# Patient Record
Sex: Female | Born: 1945 | Race: White | Hispanic: No | Marital: Married | Smoking: Former smoker
Health system: Southern US, Community
[De-identification: ages and names within clinical notes are randomized; demographics above are authoritative.]

## PROBLEM LIST (undated history)

## (undated) DIAGNOSIS — E079 Disorder of thyroid, unspecified: Secondary | ICD-10-CM

## (undated) DIAGNOSIS — I10 Essential (primary) hypertension: Secondary | ICD-10-CM

## (undated) DIAGNOSIS — E119 Type 2 diabetes mellitus without complications: Secondary | ICD-10-CM

## (undated) DIAGNOSIS — M1990 Unspecified osteoarthritis, unspecified site: Secondary | ICD-10-CM

## (undated) HISTORY — PX: JOINT REPLACEMENT: SHX530

---

## 2000-02-06 ENCOUNTER — Inpatient Hospital Stay (HOSPITAL_COMMUNITY): Admission: EM | Admit: 2000-02-06 | Discharge: 2000-02-07 | Payer: Self-pay | Admitting: Emergency Medicine

## 2000-02-06 ENCOUNTER — Encounter: Payer: Self-pay | Admitting: Emergency Medicine

## 2000-02-07 ENCOUNTER — Encounter: Payer: Self-pay | Admitting: Internal Medicine

## 2003-09-27 ENCOUNTER — Inpatient Hospital Stay (HOSPITAL_COMMUNITY): Admission: RE | Admit: 2003-09-27 | Discharge: 2003-09-30 | Payer: Self-pay | Admitting: Orthopedic Surgery

## 2006-09-03 ENCOUNTER — Encounter: Admission: RE | Admit: 2006-09-03 | Discharge: 2006-09-03 | Payer: Self-pay | Admitting: Orthopedic Surgery

## 2006-09-17 ENCOUNTER — Ambulatory Visit: Payer: Self-pay | Admitting: Internal Medicine

## 2006-09-30 ENCOUNTER — Inpatient Hospital Stay (HOSPITAL_COMMUNITY): Admission: RE | Admit: 2006-09-30 | Discharge: 2006-10-03 | Payer: Self-pay | Admitting: Orthopedic Surgery

## 2008-06-15 IMAGING — CR DG CHEST 2V
2 series · 2 of 2 positions shown · non-contrast
Comparison: none

CLINICAL DATA: Internal derangement.  Preoperative.
 CHEST ? 2 VIEW:

[view not recorded (1 of 2)]
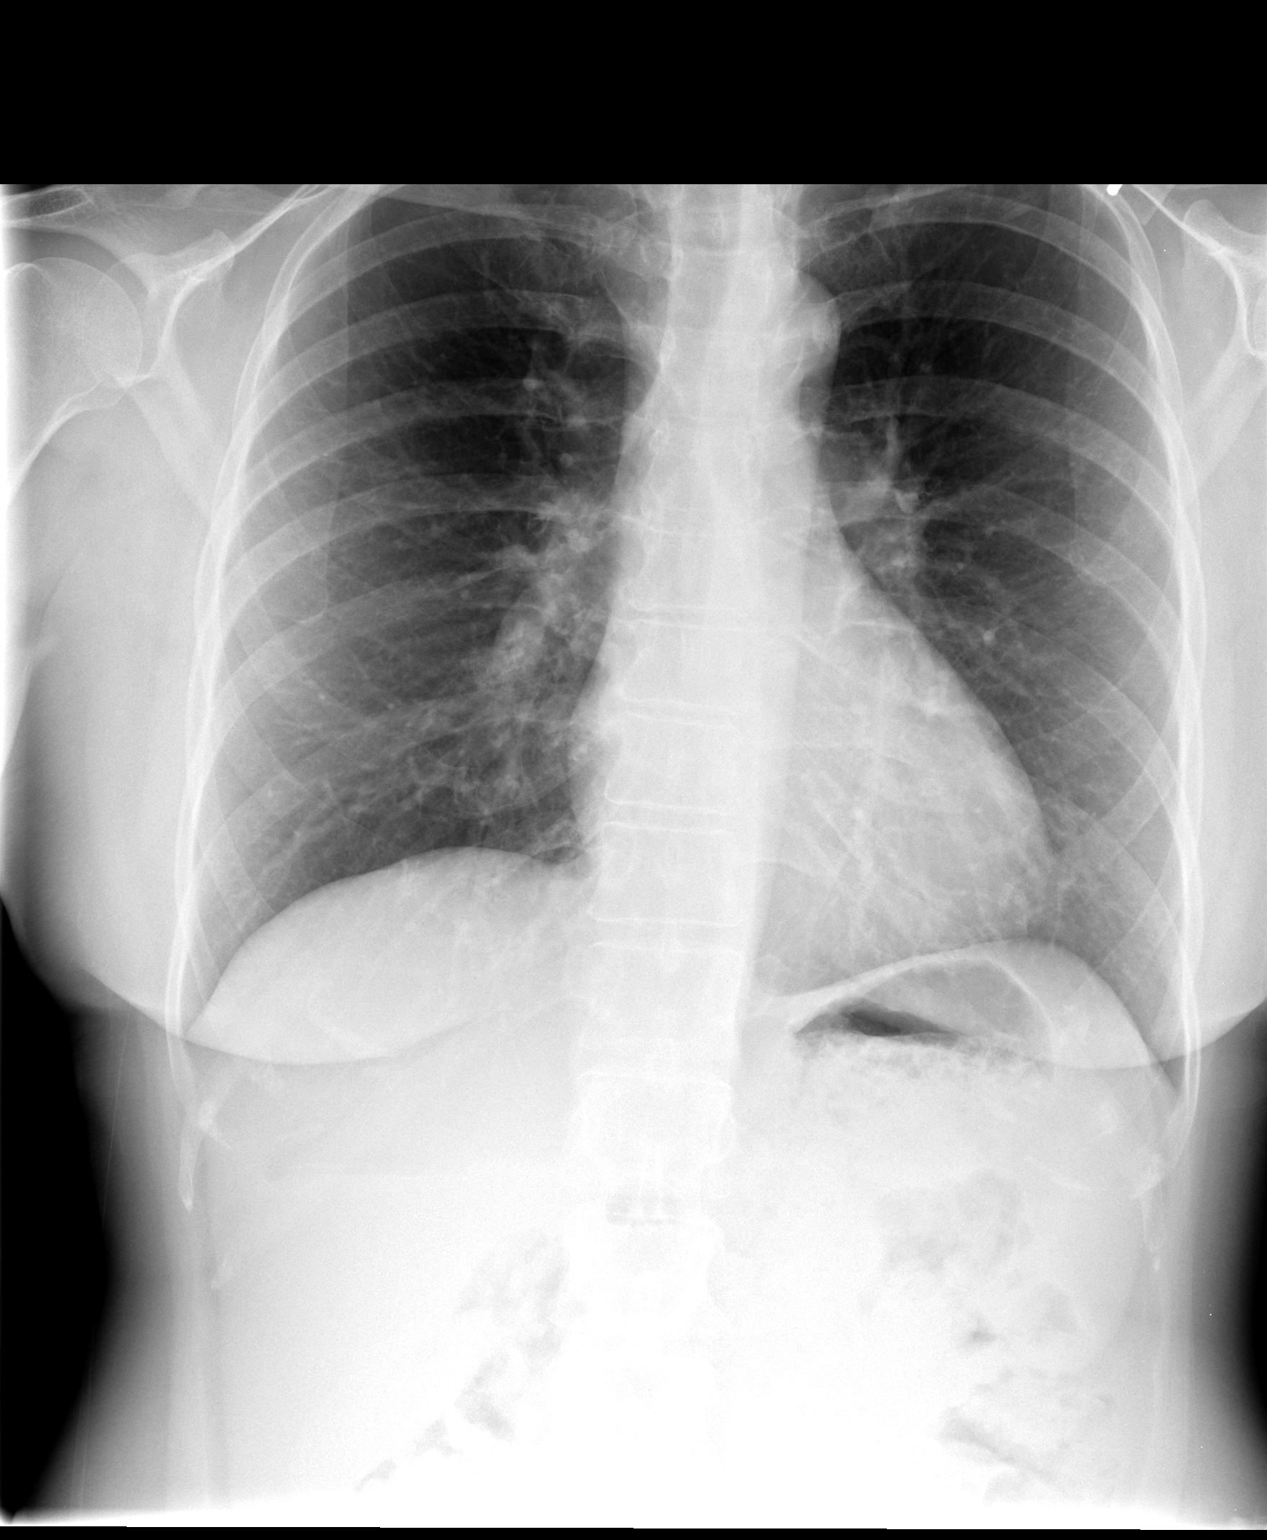

[view not recorded (2 of 2)]
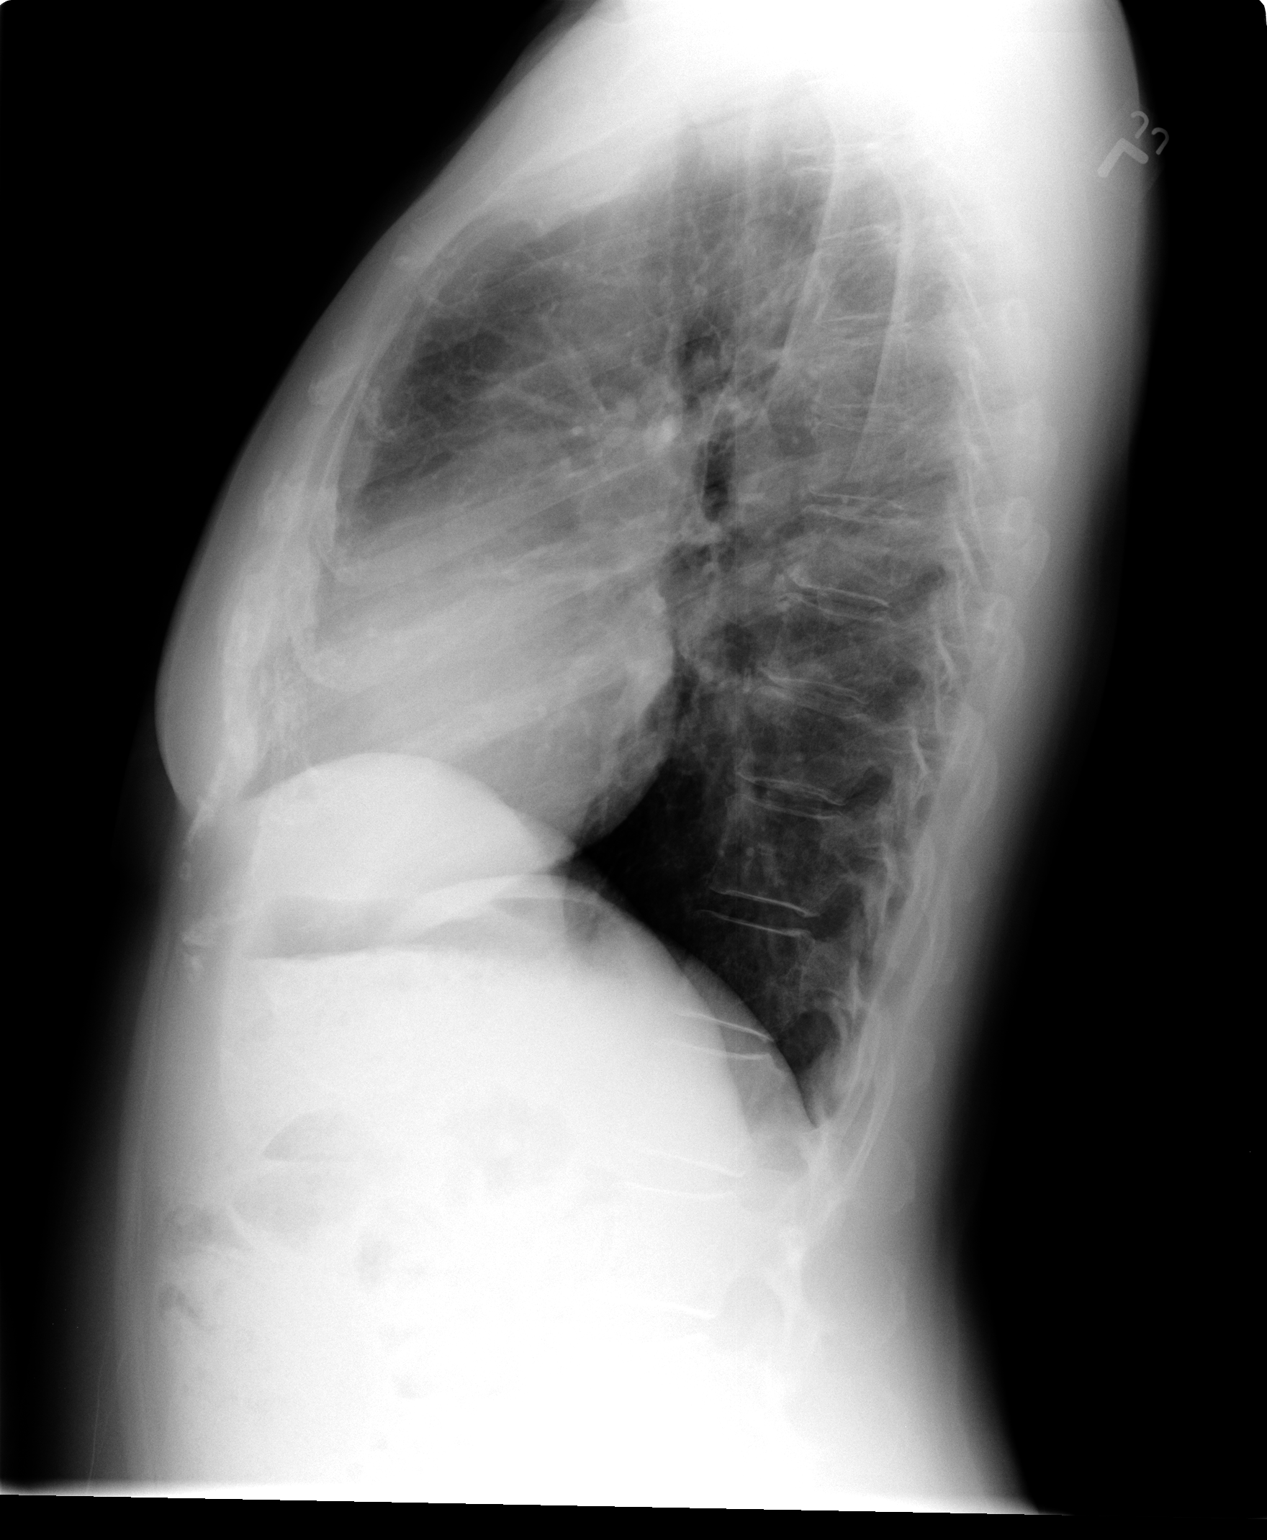

[2 of 2 positions shown; findings below may reference images not displayed]

FINDINGS: The lungs are clear and heart and mediastinal structures are normal.
IMPRESSION: No evidence for active chest disease.

## 2015-09-12 DIAGNOSIS — R609 Edema, unspecified: Secondary | ICD-10-CM | POA: Diagnosis not present

## 2015-09-12 DIAGNOSIS — M2550 Pain in unspecified joint: Secondary | ICD-10-CM | POA: Diagnosis not present

## 2015-09-12 DIAGNOSIS — M0609 Rheumatoid arthritis without rheumatoid factor, multiple sites: Secondary | ICD-10-CM | POA: Diagnosis not present

## 2015-09-12 DIAGNOSIS — M353 Polymyalgia rheumatica: Secondary | ICD-10-CM | POA: Diagnosis not present

## 2015-09-12 DIAGNOSIS — Z79899 Other long term (current) drug therapy: Secondary | ICD-10-CM | POA: Diagnosis not present

## 2015-09-12 DIAGNOSIS — Z23 Encounter for immunization: Secondary | ICD-10-CM | POA: Diagnosis not present

## 2015-09-12 DIAGNOSIS — L409 Psoriasis, unspecified: Secondary | ICD-10-CM | POA: Diagnosis not present

## 2015-10-05 ENCOUNTER — Emergency Department (HOSPITAL_COMMUNITY): Payer: Medicare HMO

## 2015-10-05 ENCOUNTER — Encounter (HOSPITAL_COMMUNITY): Payer: Self-pay | Admitting: Emergency Medicine

## 2015-10-05 ENCOUNTER — Emergency Department (HOSPITAL_COMMUNITY)
Admission: EM | Admit: 2015-10-05 | Discharge: 2015-10-05 | Disposition: A | Discharge status: Home / Self Care | Payer: Medicare HMO | Attending: Emergency Medicine | Admitting: Emergency Medicine

## 2015-10-05 DIAGNOSIS — M1990 Unspecified osteoarthritis, unspecified site: Secondary | ICD-10-CM | POA: Diagnosis not present

## 2015-10-05 DIAGNOSIS — E079 Disorder of thyroid, unspecified: Secondary | ICD-10-CM | POA: Insufficient documentation

## 2015-10-05 DIAGNOSIS — T148 Other injury of unspecified body region: Secondary | ICD-10-CM | POA: Diagnosis not present

## 2015-10-05 DIAGNOSIS — Y9389 Activity, other specified: Secondary | ICD-10-CM | POA: Insufficient documentation

## 2015-10-05 DIAGNOSIS — W108XXA Fall (on) (from) other stairs and steps, initial encounter: Secondary | ICD-10-CM | POA: Diagnosis not present

## 2015-10-05 DIAGNOSIS — E119 Type 2 diabetes mellitus without complications: Secondary | ICD-10-CM | POA: Insufficient documentation

## 2015-10-05 DIAGNOSIS — Z7951 Long term (current) use of inhaled steroids: Secondary | ICD-10-CM | POA: Insufficient documentation

## 2015-10-05 DIAGNOSIS — S42212A Unspecified displaced fracture of surgical neck of left humerus, initial encounter for closed fracture: Secondary | ICD-10-CM | POA: Insufficient documentation

## 2015-10-05 DIAGNOSIS — Y9289 Other specified places as the place of occurrence of the external cause: Secondary | ICD-10-CM | POA: Diagnosis not present

## 2015-10-05 DIAGNOSIS — S42202A Unspecified fracture of upper end of left humerus, initial encounter for closed fracture: Secondary | ICD-10-CM | POA: Diagnosis not present

## 2015-10-05 DIAGNOSIS — I10 Essential (primary) hypertension: Secondary | ICD-10-CM | POA: Diagnosis not present

## 2015-10-05 DIAGNOSIS — Z794 Long term (current) use of insulin: Secondary | ICD-10-CM | POA: Insufficient documentation

## 2015-10-05 DIAGNOSIS — S4992XA Unspecified injury of left shoulder and upper arm, initial encounter: Secondary | ICD-10-CM | POA: Diagnosis present

## 2015-10-05 DIAGNOSIS — Z87891 Personal history of nicotine dependence: Secondary | ICD-10-CM | POA: Insufficient documentation

## 2015-10-05 DIAGNOSIS — Z79899 Other long term (current) drug therapy: Secondary | ICD-10-CM | POA: Insufficient documentation

## 2015-10-05 DIAGNOSIS — Y998 Other external cause status: Secondary | ICD-10-CM | POA: Insufficient documentation

## 2015-10-05 DIAGNOSIS — M25512 Pain in left shoulder: Secondary | ICD-10-CM | POA: Diagnosis not present

## 2015-10-05 DIAGNOSIS — S42302A Unspecified fracture of shaft of humerus, left arm, initial encounter for closed fracture: Secondary | ICD-10-CM

## 2015-10-05 HISTORY — DX: Essential (primary) hypertension: I10

## 2015-10-05 HISTORY — DX: Unspecified osteoarthritis, unspecified site: M19.90

## 2015-10-05 HISTORY — DX: Disorder of thyroid, unspecified: E07.9

## 2015-10-05 HISTORY — DX: Type 2 diabetes mellitus without complications: E11.9

## 2015-10-05 MED ORDER — ONDANSETRON HCL 4 MG/2ML IJ SOLN
4.0000 mg | Freq: Once | INTRAMUSCULAR | Status: AC
  Administered 2015-10-05: 4 mg via INTRAVENOUS
  Filled 2015-10-05: qty 2

## 2015-10-05 MED ORDER — OXYCODONE-ACETAMINOPHEN 5-325 MG PO TABS: 2.0000 | ORAL_TABLET | ORAL | Status: AC | PRN

## 2015-10-05 MED ORDER — MORPHINE SULFATE (PF) 4 MG/ML IV SOLN
4.0000 mg | Freq: Once | INTRAVENOUS | Status: AC
  Administered 2015-10-05: 4 mg via INTRAVENOUS
  Filled 2015-10-05: qty 1

## 2015-10-05 MED ORDER — ONDANSETRON HCL 4 MG PO TABS: 4.0000 mg | ORAL_TABLET | Freq: Four times a day (QID) | ORAL | Status: AC

## 2015-10-05 NOTE — ED Notes (Signed)
As wheeling pt out to waiting room pt became very nauseated with vomiting. PA [facility] notified and given prescription for zofran to be d/c with. Otherwise, NAD. A/O x4. VSS

## 2015-10-05 NOTE — ED Provider Notes (Signed)
Medical screening examination/treatment/procedure(s) were conducted as a shared visit with non-physician practitioner(s) and myself.  I personally evaluated the patient during the encounter.  Pt fell this morning landing on her left shoulder.  No other injury complaints. No spinal ttp  On exam.  DC home with sling, ortho follow up   Linwood Dibbles, MD 10/06/15 [phone]

## 2015-10-05 NOTE — ED Provider Notes (Signed)
CSN: [id number]     Arrival date & time 10/05/15  1157 History   First MD Initiated Contact with Patient 10/05/15 1158     Chief Complaint  Patient presents with  . Shoulder Pain     (Consider location/radiation/quality/duration/timing/severity/associated sxs/prior Treatment) The history is provided by the patient. No language interpreter was used.  Teresa Harmon is a 69 y.o female with a history of hypertension, diabetes, thyroid disease, and arthritis who presents via EMS after sudden trip and fall while going up the stairs, landing on her left shoulder. She denies any treatment prior to arrival but was given 50 of fentanyl en route. No prior surgeries or injury to the left shoulder. She denies any numbness or tingling. She denies any head injury or loss of consciousness.  Past Medical History  Diagnosis Date  . Diabetes mellitus without complication (HCC)   . Thyroid disease   . Hypertension   . Arthritis    Past Surgical History  Procedure Laterality Date  . Joint replacement     History reviewed. No pertinent family history. Social History  Substance Use Topics  . Smoking status: Former Smoker -- 1.00 packs/day for 15 years    Types: Cigarettes  . Smokeless tobacco: None  . Alcohol Use: No   OB History    No data available     Review of Systems  Musculoskeletal: Positive for joint swelling and arthralgias.  Skin: Negative for wound.      Allergies  Review of patient's allergies indicates no known allergies.  Home Medications   Prior to Admission medications   Medication Sig Start Date End Date Taking? Authorizing Provider  alendronate (FOSAMAX) 70 MG tablet Take 70 mg by mouth once a week. Take with a full glass of water on an empty stomach.   Yes Historical Provider, MD  aspirin EC 81 MG tablet Take 81 mg by mouth daily.   Yes Historical Provider, MD  calcium-vitamin D 250-100 MG-UNIT tablet Take 1 tablet by mouth 2 (two) times daily.   Yes Historical  Provider, MD  folic acid (FOLVITE) 1 MG tablet Take 1 mg by mouth daily.   Yes Historical Provider, MD  insulin NPH-regular Human (NOVOLIN 70/30) (70-30) 100 UNIT/ML injection Inject 40 Units into the skin 3 (three) times daily.   Yes Historical Provider, MD  levothyroxine (SYNTHROID, LEVOTHROID) 100 MCG tablet Take 100 mcg by mouth daily before breakfast.   Yes Historical Provider, MD  methotrexate (RHEUMATREX) 2.5 MG tablet Take 15 mg by mouth once a week. Caution:Chemotherapy. Protect from light.   Yes Historical Provider, MD  metoprolol (LOPRESSOR) 50 MG tablet Take 50 mg by mouth 2 (two) times daily.   Yes Historical Provider, MD  predniSONE (DELTASONE) 5 MG tablet Take 5 mg by mouth every other day.   Yes Historical Provider, MD  simvastatin (ZOCOR) 40 MG tablet Take 40 mg by mouth at bedtime.   Yes Historical Provider, MD  valsartan-hydrochlorothiazide (DIOVAN-HCT) 160-12.5 MG tablet Take 1 tablet by mouth daily.   Yes Historical Provider, MD  valsartan-hydrochlorothiazide (DIOVAN-HCT) 160-25 MG tablet Take 1 tablet by mouth daily.   Yes Historical Provider, MD  ondansetron (ZOFRAN) 4 MG tablet Take 1 tablet (4 mg total) by mouth every 6 (six) hours. 10/05/15   Ryliegh Mcduffey Patel-Mills, PA-C  oxyCODONE-acetaminophen (PERCOCET/ROXICET) 5-325 MG tablet Take 2 tablets by mouth every 4 (four) hours as needed for severe pain. 10/05/15   Gabrille Kilbride Patel-Mills, PA-C   BP 140/76 mmHg  Pulse 59  Temp(Src)  98.6 F (37 C) (Oral)  Resp 20  SpO2 95% Physical Exam  Constitutional: She is oriented to person, place, and time. She appears well-developed and well-nourished.  HENT:  Head: Normocephalic and atraumatic.  Eyes: Conjunctivae are normal.  Neck: Normal range of motion. Neck supple.  Cardiovascular: Normal rate.   Pulmonary/Chest: Effort normal. No respiratory distress.  Abdominal: Soft.  Musculoskeletal: Normal range of motion.  Left shoulder: Left shoulder is in a makeshift sling. 2+ radial pulses.  Less than 2 seconds capillary refill. 5 out of 5 grip strength. Mild swelling and moderate tenderness along the proximal humerus.  No clavicle deformity.  No ecchymosis or erythema noted.  No respiratory distress or decreased breath sounds.   Neurological: She is alert and oriented to person, place, and time.  Skin: Skin is warm and dry.  Psychiatric: She has a normal mood and affect.    ED Course  Procedures (including critical care time) Labs Review Labs Reviewed - No data to display  Imaging Review Dg Shoulder Left  10/05/2015  CLINICAL DATA:  LEFT shoulder pain and swelling after fall EXAM: LEFT SHOULDER - 2+ VIEW COMPARISON:  None. FINDINGS: Impaction fracture at the surgical neck of the proximal LEFT humerus. The glenohumeral joint is intact. IMPRESSION: Impaction fracture of the proximal LEFT humerus. Electronically Signed   By: Genevive Bi M.D.   On: 10/05/2015 13:04   I have personally reviewed and evaluated these image results as part of my medical decision-making.   EKG Interpretation None      MDM   Final diagnoses:  Humerus fracture, left, closed, initial encounter   Patient presents for left shoulder pain after trip and fall while going up the stairs. She is well-appearing and in no acute distress. Her vital signs are stable. After fentanyl given en route by EMS, patient states her pain is only dull now. X-ray of the left shoulder shows an impaction fracture of the proximal left humerus. She has no sensory deficit and pulses are in tact. The patient was put in a left arm sling and given follow-up with Dr.Xu, orthopedics. She was given Percocet for pain control. I discussed return precautions. Patient verbally agrees with the plan. Upon discharge patient had one episode of vomiting after being given morphine with Zofran. Additional Zofran was ordered. I believe this is most likely from pain medications. Patient was given prescription for Zofran.   Catha Gosselin, PA-C 10/05/15 [phone]

## 2015-10-05 NOTE — Discharge Instructions (Signed)
Humerus Fracture Treated With Immobilization Follow-up with Dr. Roda Shutters, the orthopedist, by calling his office.  Take Percocet as needed for pain. Wear shoulder immobilizer except when showering. The humerus is the large bone in your upper arm. You have a broken (fractured) humerus. These fractures are easily diagnosed with X-rays. TREATMENT  Simple fractures which will heal without disability are treated with simple immobilization. Immobilization means you will wear a cast, splint, or sling. You have a fracture which will do well with immobilization. The fracture will heal well simply by being held in a good position until it is stable enough to begin range of motion exercises. Do not take part in activities which would further injure your arm.  HOME CARE INSTRUCTIONS   Put ice on the injured area.  Put ice in a plastic bag.  Place a towel between your skin and the bag.  Leave the ice on for 15-20 minutes, 03-04 times a day.  If you have a cast:  Do not scratch the skin under the cast using sharp or pointed objects.  Check the skin around the cast every day. You may put lotion on any red or sore areas.  Keep your cast dry and clean.  If you have a splint:  Wear the splint as directed.  Keep your splint dry and clean.  You may loosen the elastic around the splint if your fingers become numb, tingle, or turn cold or blue.  If you have a sling:  Wear the sling as directed.  Do not put pressure on any part of your cast or splint until it is fully hardened.  Your cast or splint can be protected during bathing with a plastic bag. Do not lower the cast or splint into water.  Only take over-the-counter or prescription medicines for pain, discomfort, or fever as directed by your caregiver.  Do range of motion exercises as instructed by your caregiver.  Follow up as directed by your caregiver. This is very important in order to avoid permanent injury or disability and chronic  pain. SEEK IMMEDIATE MEDICAL CARE IF:   Your skin or nails in the injured arm turn blue or gray.  Your arm feels cold or numb.  You develop severe pain in the injured arm.  You are having problems with the medicines you were given. MAKE SURE YOU:   Understand these instructions.  Will watch your condition.  Will get help right away if you are not doing well or get worse.   This information is not intended to replace advice given to you by your health care provider. Make sure you discuss any questions you have with your health care provider.   Document Released: 02/11/2001 Document Revised: 11/26/2014 Document Reviewed: 03/30/2015 Elsevier Interactive Patient Education [employer]

## 2015-10-05 NOTE — ED Notes (Signed)
Attempted to apply arm sling to pt, however pt refusing to wear it until MD Knapp steps in to assess patient.

## 2015-10-05 NOTE — ED Notes (Signed)
Pt to ER via [facility] after tripping up the stairs and falling landing on her left shoulder. Pt complaining of severe pain, was given 50 mcg of fentanyl in route. Swelling to left shoulder noted. Pulses and sensation present in left arm. VSS on arrival.

## 2015-10-06 DIAGNOSIS — S42202A Unspecified fracture of upper end of left humerus, initial encounter for closed fracture: Secondary | ICD-10-CM | POA: Diagnosis not present

## 2015-10-17 DIAGNOSIS — S42202D Unspecified fracture of upper end of left humerus, subsequent encounter for fracture with routine healing: Secondary | ICD-10-CM | POA: Diagnosis not present

## 2015-10-17 DIAGNOSIS — S52124A Nondisplaced fracture of head of right radius, initial encounter for closed fracture: Secondary | ICD-10-CM | POA: Diagnosis not present

## 2015-10-19 DIAGNOSIS — E039 Hypothyroidism, unspecified: Secondary | ICD-10-CM | POA: Diagnosis not present

## 2015-10-19 DIAGNOSIS — E782 Mixed hyperlipidemia: Secondary | ICD-10-CM | POA: Diagnosis not present

## 2015-10-19 DIAGNOSIS — Z79899 Other long term (current) drug therapy: Secondary | ICD-10-CM | POA: Diagnosis not present

## 2015-10-19 DIAGNOSIS — D509 Iron deficiency anemia, unspecified: Secondary | ICD-10-CM | POA: Diagnosis not present

## 2015-10-19 DIAGNOSIS — I10 Essential (primary) hypertension: Secondary | ICD-10-CM | POA: Diagnosis not present

## 2015-10-19 DIAGNOSIS — E559 Vitamin D deficiency, unspecified: Secondary | ICD-10-CM | POA: Diagnosis not present

## 2015-10-19 DIAGNOSIS — E1169 Type 2 diabetes mellitus with other specified complication: Secondary | ICD-10-CM | POA: Diagnosis not present

## 2015-10-19 DIAGNOSIS — Z13820 Encounter for screening for osteoporosis: Secondary | ICD-10-CM | POA: Diagnosis not present

## 2015-10-19 DIAGNOSIS — Z1231 Encounter for screening mammogram for malignant neoplasm of breast: Secondary | ICD-10-CM | POA: Diagnosis not present

## 2015-10-19 DIAGNOSIS — Z0000 Encounter for general adult medical examination without abnormal findings: Secondary | ICD-10-CM | POA: Diagnosis not present

## 2015-10-19 DIAGNOSIS — Z1211 Encounter for screening for malignant neoplasm of colon: Secondary | ICD-10-CM | POA: Diagnosis not present

## 2015-10-21 DIAGNOSIS — R8290 Unspecified abnormal findings in urine: Secondary | ICD-10-CM | POA: Diagnosis not present

## 2015-11-08 DIAGNOSIS — E039 Hypothyroidism, unspecified: Secondary | ICD-10-CM | POA: Diagnosis not present

## 2015-11-08 DIAGNOSIS — I10 Essential (primary) hypertension: Secondary | ICD-10-CM | POA: Diagnosis not present

## 2015-11-08 DIAGNOSIS — Z0000 Encounter for general adult medical examination without abnormal findings: Secondary | ICD-10-CM | POA: Diagnosis not present

## 2015-11-08 DIAGNOSIS — Z136 Encounter for screening for cardiovascular disorders: Secondary | ICD-10-CM | POA: Diagnosis not present

## 2015-11-08 DIAGNOSIS — Z1211 Encounter for screening for malignant neoplasm of colon: Secondary | ICD-10-CM | POA: Diagnosis not present

## 2015-11-08 DIAGNOSIS — Z1231 Encounter for screening mammogram for malignant neoplasm of breast: Secondary | ICD-10-CM | POA: Diagnosis not present

## 2015-11-08 DIAGNOSIS — Z6831 Body mass index (BMI) 31.0-31.9, adult: Secondary | ICD-10-CM | POA: Diagnosis not present

## 2015-11-08 DIAGNOSIS — Z13820 Encounter for screening for osteoporosis: Secondary | ICD-10-CM | POA: Diagnosis not present

## 2015-11-08 DIAGNOSIS — E1169 Type 2 diabetes mellitus with other specified complication: Secondary | ICD-10-CM | POA: Diagnosis not present

## 2015-11-08 DIAGNOSIS — E782 Mixed hyperlipidemia: Secondary | ICD-10-CM | POA: Diagnosis not present

## 2015-12-09 DIAGNOSIS — E782 Mixed hyperlipidemia: Secondary | ICD-10-CM | POA: Diagnosis not present

## 2015-12-09 DIAGNOSIS — E1169 Type 2 diabetes mellitus with other specified complication: Secondary | ICD-10-CM | POA: Diagnosis not present

## 2015-12-09 DIAGNOSIS — E039 Hypothyroidism, unspecified: Secondary | ICD-10-CM | POA: Diagnosis not present

## 2015-12-12 DIAGNOSIS — M0609 Rheumatoid arthritis without rheumatoid factor, multiple sites: Secondary | ICD-10-CM | POA: Diagnosis not present

## 2015-12-12 DIAGNOSIS — Z79899 Other long term (current) drug therapy: Secondary | ICD-10-CM | POA: Diagnosis not present

## 2015-12-12 DIAGNOSIS — M810 Age-related osteoporosis without current pathological fracture: Secondary | ICD-10-CM | POA: Diagnosis not present

## 2015-12-13 DIAGNOSIS — Z1231 Encounter for screening mammogram for malignant neoplasm of breast: Secondary | ICD-10-CM | POA: Diagnosis not present

## 2015-12-17 DIAGNOSIS — R69 Illness, unspecified: Secondary | ICD-10-CM | POA: Diagnosis not present

## 2016-01-10 DIAGNOSIS — S42202D Unspecified fracture of upper end of left humerus, subsequent encounter for fracture with routine healing: Secondary | ICD-10-CM | POA: Diagnosis not present

## 2016-02-13 DIAGNOSIS — E1169 Type 2 diabetes mellitus with other specified complication: Secondary | ICD-10-CM | POA: Diagnosis not present

## 2016-02-13 DIAGNOSIS — E039 Hypothyroidism, unspecified: Secondary | ICD-10-CM | POA: Diagnosis not present

## 2016-02-13 DIAGNOSIS — E785 Hyperlipidemia, unspecified: Secondary | ICD-10-CM | POA: Diagnosis not present

## 2016-02-13 DIAGNOSIS — E782 Mixed hyperlipidemia: Secondary | ICD-10-CM | POA: Diagnosis not present

## 2016-02-13 DIAGNOSIS — I10 Essential (primary) hypertension: Secondary | ICD-10-CM | POA: Diagnosis not present

## 2016-02-13 DIAGNOSIS — E559 Vitamin D deficiency, unspecified: Secondary | ICD-10-CM | POA: Diagnosis not present

## 2016-03-12 DIAGNOSIS — E782 Mixed hyperlipidemia: Secondary | ICD-10-CM | POA: Diagnosis not present

## 2016-03-12 DIAGNOSIS — Z79899 Other long term (current) drug therapy: Secondary | ICD-10-CM | POA: Diagnosis not present

## 2016-03-12 DIAGNOSIS — M2550 Pain in unspecified joint: Secondary | ICD-10-CM | POA: Diagnosis not present

## 2016-03-12 DIAGNOSIS — M0609 Rheumatoid arthritis without rheumatoid factor, multiple sites: Secondary | ICD-10-CM | POA: Diagnosis not present

## 2016-03-22 DIAGNOSIS — R69 Illness, unspecified: Secondary | ICD-10-CM | POA: Diagnosis not present

## 2016-05-16 DIAGNOSIS — E039 Hypothyroidism, unspecified: Secondary | ICD-10-CM | POA: Diagnosis not present

## 2016-05-16 DIAGNOSIS — E782 Mixed hyperlipidemia: Secondary | ICD-10-CM | POA: Diagnosis not present

## 2016-05-16 DIAGNOSIS — I10 Essential (primary) hypertension: Secondary | ICD-10-CM | POA: Diagnosis not present

## 2016-05-16 DIAGNOSIS — Z79899 Other long term (current) drug therapy: Secondary | ICD-10-CM | POA: Diagnosis not present

## 2016-05-16 DIAGNOSIS — E559 Vitamin D deficiency, unspecified: Secondary | ICD-10-CM | POA: Diagnosis not present

## 2016-05-16 DIAGNOSIS — E1169 Type 2 diabetes mellitus with other specified complication: Secondary | ICD-10-CM | POA: Diagnosis not present

## 2016-05-16 DIAGNOSIS — Z794 Long term (current) use of insulin: Secondary | ICD-10-CM | POA: Diagnosis not present

## 2016-06-11 DIAGNOSIS — Z79899 Other long term (current) drug therapy: Secondary | ICD-10-CM | POA: Diagnosis not present

## 2016-06-11 DIAGNOSIS — M06032 Rheumatoid arthritis without rheumatoid factor, left wrist: Secondary | ICD-10-CM | POA: Diagnosis not present

## 2016-06-18 DIAGNOSIS — R69 Illness, unspecified: Secondary | ICD-10-CM | POA: Diagnosis not present

## 2016-07-27 DIAGNOSIS — M0609 Rheumatoid arthritis without rheumatoid factor, multiple sites: Secondary | ICD-10-CM | POA: Diagnosis not present

## 2016-07-27 DIAGNOSIS — Z79899 Other long term (current) drug therapy: Secondary | ICD-10-CM | POA: Diagnosis not present

## 2016-07-27 DIAGNOSIS — M2550 Pain in unspecified joint: Secondary | ICD-10-CM | POA: Diagnosis not present

## 2016-08-16 DIAGNOSIS — Z23 Encounter for immunization: Secondary | ICD-10-CM | POA: Diagnosis not present

## 2016-08-16 DIAGNOSIS — E782 Mixed hyperlipidemia: Secondary | ICD-10-CM | POA: Diagnosis not present

## 2016-08-16 DIAGNOSIS — M79674 Pain in right toe(s): Secondary | ICD-10-CM | POA: Diagnosis not present

## 2016-08-16 DIAGNOSIS — E039 Hypothyroidism, unspecified: Secondary | ICD-10-CM | POA: Diagnosis not present

## 2016-08-16 DIAGNOSIS — M069 Rheumatoid arthritis, unspecified: Secondary | ICD-10-CM | POA: Diagnosis not present

## 2016-08-16 DIAGNOSIS — E1169 Type 2 diabetes mellitus with other specified complication: Secondary | ICD-10-CM | POA: Diagnosis not present

## 2016-08-16 DIAGNOSIS — E559 Vitamin D deficiency, unspecified: Secondary | ICD-10-CM | POA: Diagnosis not present

## 2016-08-16 DIAGNOSIS — I10 Essential (primary) hypertension: Secondary | ICD-10-CM | POA: Diagnosis not present

## 2016-10-16 DIAGNOSIS — R05 Cough: Secondary | ICD-10-CM | POA: Diagnosis not present

## 2016-10-22 DIAGNOSIS — E669 Obesity, unspecified: Secondary | ICD-10-CM | POA: Diagnosis not present

## 2016-10-22 DIAGNOSIS — E782 Mixed hyperlipidemia: Secondary | ICD-10-CM | POA: Diagnosis not present

## 2016-10-22 DIAGNOSIS — E1169 Type 2 diabetes mellitus with other specified complication: Secondary | ICD-10-CM | POA: Diagnosis not present

## 2016-10-22 DIAGNOSIS — Z1389 Encounter for screening for other disorder: Secondary | ICD-10-CM | POA: Diagnosis not present

## 2016-10-22 DIAGNOSIS — Z0000 Encounter for general adult medical examination without abnormal findings: Secondary | ICD-10-CM | POA: Diagnosis not present

## 2016-10-22 DIAGNOSIS — Z1231 Encounter for screening mammogram for malignant neoplasm of breast: Secondary | ICD-10-CM | POA: Diagnosis not present

## 2016-10-22 DIAGNOSIS — Z1211 Encounter for screening for malignant neoplasm of colon: Secondary | ICD-10-CM | POA: Diagnosis not present

## 2016-10-22 DIAGNOSIS — Z9181 History of falling: Secondary | ICD-10-CM | POA: Diagnosis not present

## 2016-10-30 DIAGNOSIS — R05 Cough: Secondary | ICD-10-CM | POA: Diagnosis not present

## 2016-10-30 DIAGNOSIS — M2550 Pain in unspecified joint: Secondary | ICD-10-CM | POA: Diagnosis not present

## 2016-10-30 DIAGNOSIS — Z79899 Other long term (current) drug therapy: Secondary | ICD-10-CM | POA: Diagnosis not present

## 2016-10-30 DIAGNOSIS — M0609 Rheumatoid arthritis without rheumatoid factor, multiple sites: Secondary | ICD-10-CM | POA: Diagnosis not present

## 2016-11-01 DIAGNOSIS — R69 Illness, unspecified: Secondary | ICD-10-CM | POA: Diagnosis not present

## 2016-12-17 DIAGNOSIS — Z1231 Encounter for screening mammogram for malignant neoplasm of breast: Secondary | ICD-10-CM | POA: Diagnosis not present

## 2016-12-17 DIAGNOSIS — I10 Essential (primary) hypertension: Secondary | ICD-10-CM | POA: Diagnosis not present

## 2016-12-17 DIAGNOSIS — E039 Hypothyroidism, unspecified: Secondary | ICD-10-CM | POA: Diagnosis not present

## 2016-12-17 DIAGNOSIS — E559 Vitamin D deficiency, unspecified: Secondary | ICD-10-CM | POA: Diagnosis not present

## 2016-12-17 DIAGNOSIS — E782 Mixed hyperlipidemia: Secondary | ICD-10-CM | POA: Diagnosis not present

## 2016-12-17 DIAGNOSIS — E1169 Type 2 diabetes mellitus with other specified complication: Secondary | ICD-10-CM | POA: Diagnosis not present

## 2016-12-17 DIAGNOSIS — M0609 Rheumatoid arthritis without rheumatoid factor, multiple sites: Secondary | ICD-10-CM | POA: Diagnosis not present

## 2016-12-17 DIAGNOSIS — Z794 Long term (current) use of insulin: Secondary | ICD-10-CM | POA: Diagnosis not present

## 2016-12-20 DIAGNOSIS — R69 Illness, unspecified: Secondary | ICD-10-CM | POA: Diagnosis not present

## 2016-12-24 DIAGNOSIS — Z1231 Encounter for screening mammogram for malignant neoplasm of breast: Secondary | ICD-10-CM | POA: Diagnosis not present

## 2017-02-01 DIAGNOSIS — M2550 Pain in unspecified joint: Secondary | ICD-10-CM | POA: Diagnosis not present

## 2017-02-01 DIAGNOSIS — Z79899 Other long term (current) drug therapy: Secondary | ICD-10-CM | POA: Diagnosis not present

## 2017-02-01 DIAGNOSIS — M0609 Rheumatoid arthritis without rheumatoid factor, multiple sites: Secondary | ICD-10-CM | POA: Diagnosis not present

## 2017-02-04 DIAGNOSIS — R69 Illness, unspecified: Secondary | ICD-10-CM | POA: Diagnosis not present

## 2017-03-18 DIAGNOSIS — E039 Hypothyroidism, unspecified: Secondary | ICD-10-CM | POA: Diagnosis not present

## 2017-03-18 DIAGNOSIS — I10 Essential (primary) hypertension: Secondary | ICD-10-CM | POA: Diagnosis not present

## 2017-03-18 DIAGNOSIS — E559 Vitamin D deficiency, unspecified: Secondary | ICD-10-CM | POA: Diagnosis not present

## 2017-03-18 DIAGNOSIS — E782 Mixed hyperlipidemia: Secondary | ICD-10-CM | POA: Diagnosis not present

## 2017-03-18 DIAGNOSIS — E1169 Type 2 diabetes mellitus with other specified complication: Secondary | ICD-10-CM | POA: Diagnosis not present

## 2017-03-19 DIAGNOSIS — R69 Illness, unspecified: Secondary | ICD-10-CM | POA: Diagnosis not present

## 2017-04-26 DIAGNOSIS — R69 Illness, unspecified: Secondary | ICD-10-CM | POA: Diagnosis not present

## 2017-05-10 DIAGNOSIS — Z79899 Other long term (current) drug therapy: Secondary | ICD-10-CM | POA: Diagnosis not present

## 2017-05-10 DIAGNOSIS — M0609 Rheumatoid arthritis without rheumatoid factor, multiple sites: Secondary | ICD-10-CM | POA: Diagnosis not present

## 2017-05-10 DIAGNOSIS — M8580 Other specified disorders of bone density and structure, unspecified site: Secondary | ICD-10-CM | POA: Diagnosis not present

## 2017-05-10 DIAGNOSIS — M2550 Pain in unspecified joint: Secondary | ICD-10-CM | POA: Diagnosis not present

## 2017-06-08 DIAGNOSIS — R69 Illness, unspecified: Secondary | ICD-10-CM | POA: Diagnosis not present

## 2017-06-26 IMAGING — CR DG SHOULDER 2+V*L*
2 series · 3 of 3 positions shown · non-contrast
Comparison: None.

CLINICAL DATA: LEFT shoulder pain and swelling after fall

EXAM:
LEFT SHOULDER - 2+ VIEW

[shoulder y view (1 of 2)]
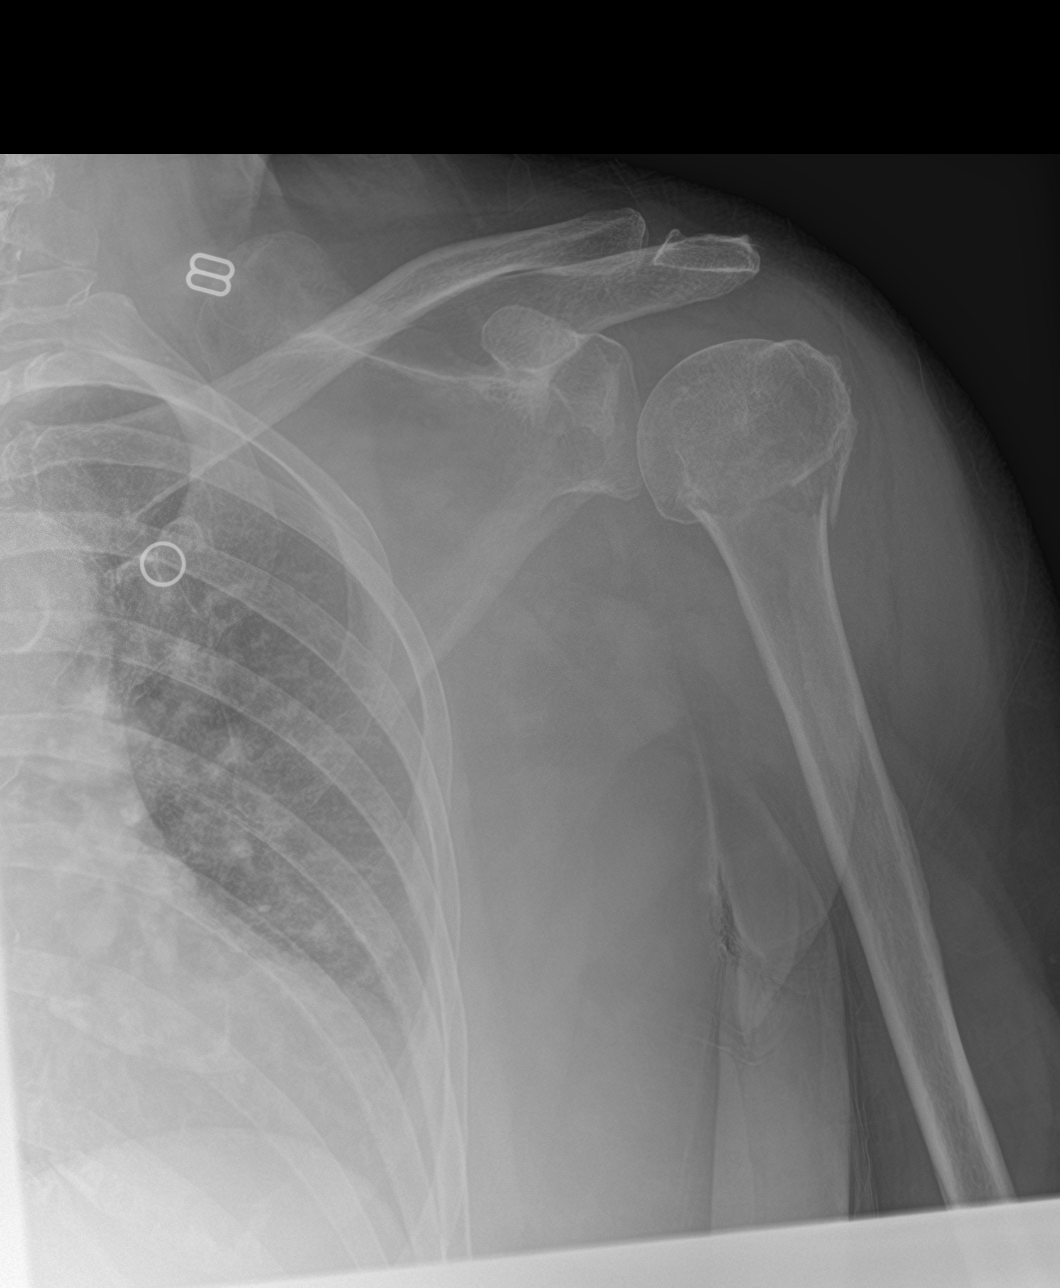

[Series 6: shoulder y view · 0.14mm/px · 2 of 2 slices shown (2 of 2)]
[im 1/2]
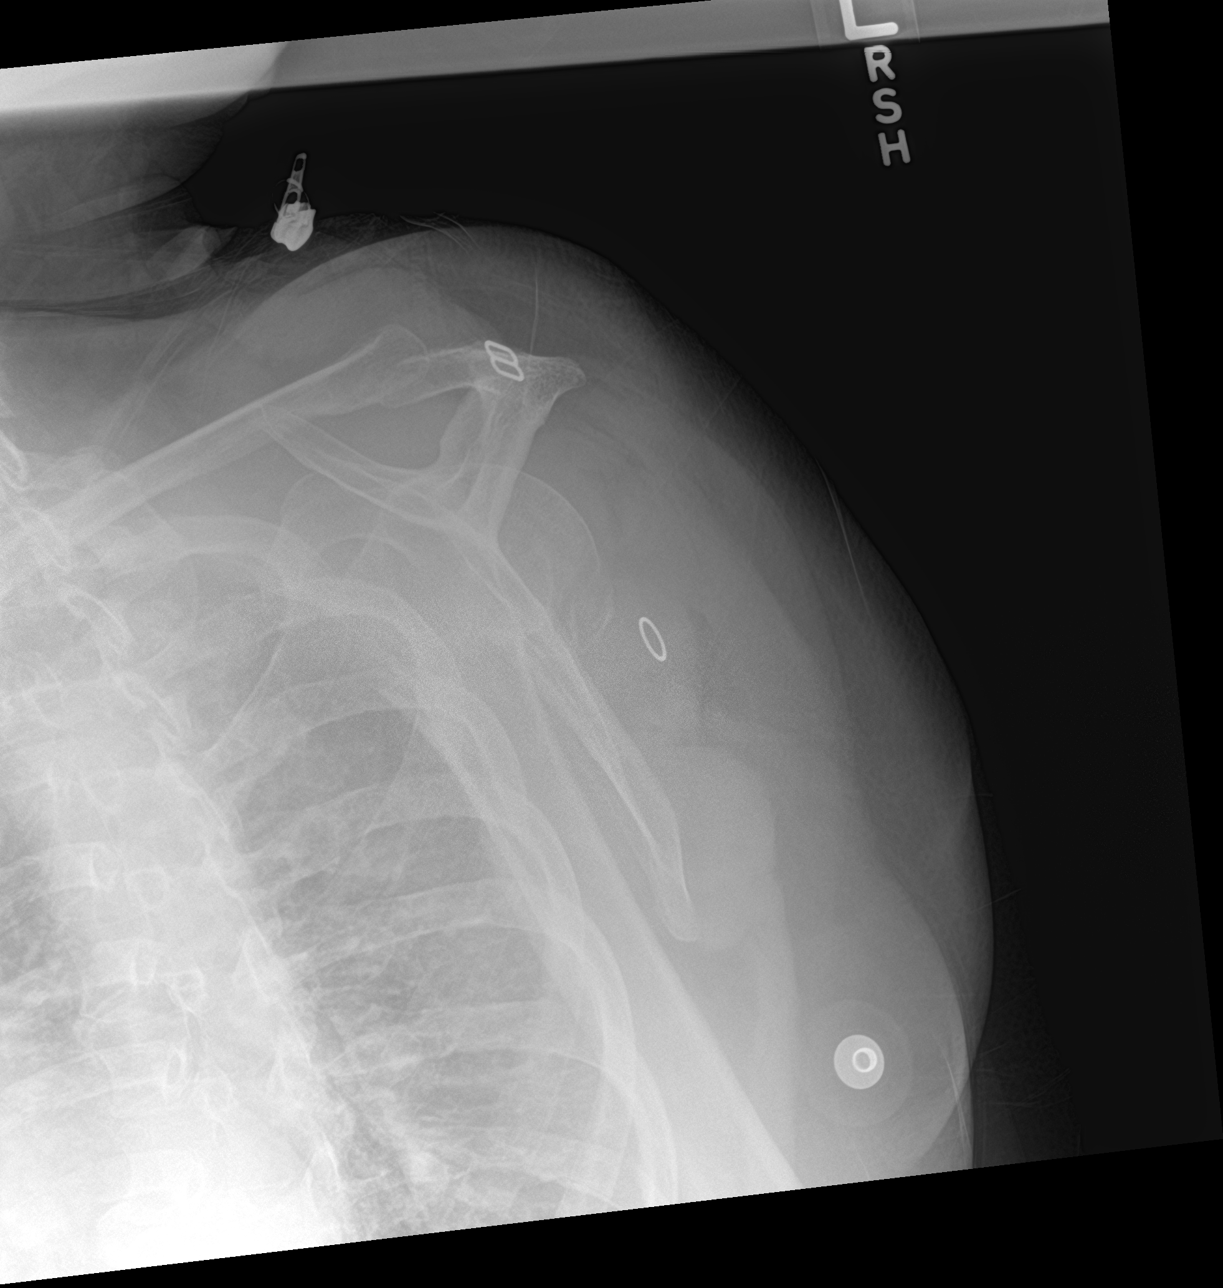
[im 2/2]
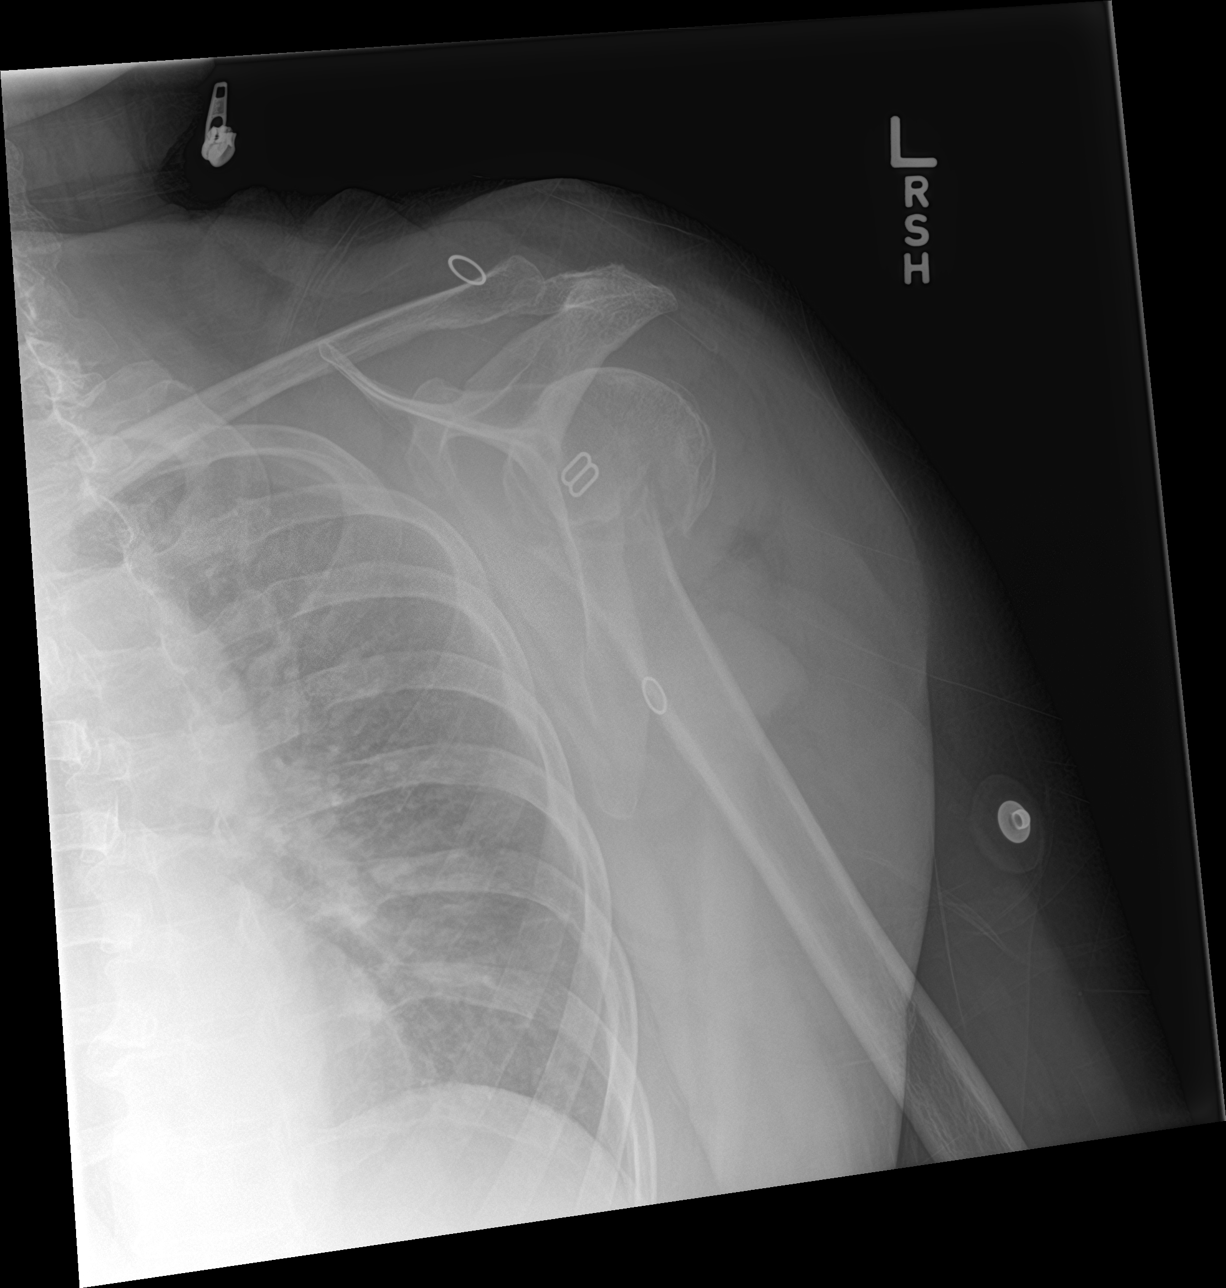

[3 of 3 positions shown; findings below may reference images not displayed]

FINDINGS: Impaction fracture at the surgical neck of the proximal LEFT
humerus. The glenohumeral joint is intact.
IMPRESSION: Impaction fracture of the proximal LEFT humerus.

## 2017-07-19 DIAGNOSIS — E039 Hypothyroidism, unspecified: Secondary | ICD-10-CM | POA: Diagnosis not present

## 2017-07-19 DIAGNOSIS — I10 Essential (primary) hypertension: Secondary | ICD-10-CM | POA: Diagnosis not present

## 2017-07-19 DIAGNOSIS — E782 Mixed hyperlipidemia: Secondary | ICD-10-CM | POA: Diagnosis not present

## 2017-07-19 DIAGNOSIS — E559 Vitamin D deficiency, unspecified: Secondary | ICD-10-CM | POA: Diagnosis not present

## 2017-07-19 DIAGNOSIS — E1169 Type 2 diabetes mellitus with other specified complication: Secondary | ICD-10-CM | POA: Diagnosis not present

## 2017-07-25 DIAGNOSIS — R69 Illness, unspecified: Secondary | ICD-10-CM | POA: Diagnosis not present

## 2017-08-30 DIAGNOSIS — M0609 Rheumatoid arthritis without rheumatoid factor, multiple sites: Secondary | ICD-10-CM | POA: Diagnosis not present

## 2017-08-30 DIAGNOSIS — M8580 Other specified disorders of bone density and structure, unspecified site: Secondary | ICD-10-CM | POA: Diagnosis not present

## 2017-08-30 DIAGNOSIS — G8929 Other chronic pain: Secondary | ICD-10-CM | POA: Diagnosis not present

## 2017-08-30 DIAGNOSIS — Z79899 Other long term (current) drug therapy: Secondary | ICD-10-CM | POA: Diagnosis not present

## 2017-08-30 DIAGNOSIS — Z23 Encounter for immunization: Secondary | ICD-10-CM | POA: Diagnosis not present

## 2017-08-30 DIAGNOSIS — M79645 Pain in left finger(s): Secondary | ICD-10-CM | POA: Diagnosis not present

## 2017-09-09 DIAGNOSIS — R69 Illness, unspecified: Secondary | ICD-10-CM | POA: Diagnosis not present

## 2017-10-18 DIAGNOSIS — R69 Illness, unspecified: Secondary | ICD-10-CM | POA: Diagnosis not present

## 2017-11-05 DIAGNOSIS — R8290 Unspecified abnormal findings in urine: Secondary | ICD-10-CM | POA: Diagnosis not present

## 2017-11-05 DIAGNOSIS — E039 Hypothyroidism, unspecified: Secondary | ICD-10-CM | POA: Diagnosis not present

## 2017-11-05 DIAGNOSIS — Z79899 Other long term (current) drug therapy: Secondary | ICD-10-CM | POA: Diagnosis not present

## 2017-11-05 DIAGNOSIS — Z0000 Encounter for general adult medical examination without abnormal findings: Secondary | ICD-10-CM | POA: Diagnosis not present

## 2017-11-05 DIAGNOSIS — E669 Obesity, unspecified: Secondary | ICD-10-CM | POA: Diagnosis not present

## 2017-11-05 DIAGNOSIS — Z1331 Encounter for screening for depression: Secondary | ICD-10-CM | POA: Diagnosis not present

## 2017-11-05 DIAGNOSIS — Z1211 Encounter for screening for malignant neoplasm of colon: Secondary | ICD-10-CM | POA: Diagnosis not present

## 2017-11-05 DIAGNOSIS — E1169 Type 2 diabetes mellitus with other specified complication: Secondary | ICD-10-CM | POA: Diagnosis not present

## 2017-11-05 DIAGNOSIS — E559 Vitamin D deficiency, unspecified: Secondary | ICD-10-CM | POA: Diagnosis not present

## 2017-11-05 DIAGNOSIS — E782 Mixed hyperlipidemia: Secondary | ICD-10-CM | POA: Diagnosis not present

## 2017-11-05 DIAGNOSIS — Z9181 History of falling: Secondary | ICD-10-CM | POA: Diagnosis not present

## 2017-11-21 DIAGNOSIS — N182 Chronic kidney disease, stage 2 (mild): Secondary | ICD-10-CM | POA: Diagnosis not present

## 2017-11-21 DIAGNOSIS — E039 Hypothyroidism, unspecified: Secondary | ICD-10-CM | POA: Diagnosis not present

## 2017-11-21 DIAGNOSIS — E1122 Type 2 diabetes mellitus with diabetic chronic kidney disease: Secondary | ICD-10-CM | POA: Diagnosis not present

## 2017-11-21 DIAGNOSIS — E1169 Type 2 diabetes mellitus with other specified complication: Secondary | ICD-10-CM | POA: Diagnosis not present

## 2017-11-21 DIAGNOSIS — E782 Mixed hyperlipidemia: Secondary | ICD-10-CM | POA: Diagnosis not present

## 2017-11-21 DIAGNOSIS — I10 Essential (primary) hypertension: Secondary | ICD-10-CM | POA: Diagnosis not present

## 2017-11-21 DIAGNOSIS — I129 Hypertensive chronic kidney disease with stage 1 through stage 4 chronic kidney disease, or unspecified chronic kidney disease: Secondary | ICD-10-CM | POA: Diagnosis not present

## 2017-11-21 DIAGNOSIS — E559 Vitamin D deficiency, unspecified: Secondary | ICD-10-CM | POA: Diagnosis not present

## 2017-11-21 DIAGNOSIS — M0609 Rheumatoid arthritis without rheumatoid factor, multiple sites: Secondary | ICD-10-CM | POA: Diagnosis not present

## 2017-12-10 DIAGNOSIS — Z0000 Encounter for general adult medical examination without abnormal findings: Secondary | ICD-10-CM | POA: Diagnosis not present

## 2017-12-10 DIAGNOSIS — Z6833 Body mass index (BMI) 33.0-33.9, adult: Secondary | ICD-10-CM | POA: Diagnosis not present

## 2017-12-10 DIAGNOSIS — I10 Essential (primary) hypertension: Secondary | ICD-10-CM | POA: Diagnosis not present

## 2017-12-10 DIAGNOSIS — E669 Obesity, unspecified: Secondary | ICD-10-CM | POA: Diagnosis not present

## 2017-12-10 DIAGNOSIS — Z1331 Encounter for screening for depression: Secondary | ICD-10-CM | POA: Diagnosis not present

## 2017-12-10 DIAGNOSIS — E785 Hyperlipidemia, unspecified: Secondary | ICD-10-CM | POA: Diagnosis not present

## 2017-12-10 DIAGNOSIS — Z136 Encounter for screening for cardiovascular disorders: Secondary | ICD-10-CM | POA: Diagnosis not present

## 2017-12-10 DIAGNOSIS — Z1231 Encounter for screening mammogram for malignant neoplasm of breast: Secondary | ICD-10-CM | POA: Diagnosis not present

## 2017-12-10 DIAGNOSIS — Z9181 History of falling: Secondary | ICD-10-CM | POA: Diagnosis not present

## 2017-12-10 DIAGNOSIS — N959 Unspecified menopausal and perimenopausal disorder: Secondary | ICD-10-CM | POA: Diagnosis not present

## 2017-12-12 DIAGNOSIS — R69 Illness, unspecified: Secondary | ICD-10-CM | POA: Diagnosis not present

## 2017-12-25 DIAGNOSIS — M85852 Other specified disorders of bone density and structure, left thigh: Secondary | ICD-10-CM | POA: Diagnosis not present

## 2017-12-25 DIAGNOSIS — N959 Unspecified menopausal and perimenopausal disorder: Secondary | ICD-10-CM | POA: Diagnosis not present

## 2017-12-25 DIAGNOSIS — Z1231 Encounter for screening mammogram for malignant neoplasm of breast: Secondary | ICD-10-CM | POA: Diagnosis not present

## 2018-01-02 DIAGNOSIS — Z79899 Other long term (current) drug therapy: Secondary | ICD-10-CM | POA: Diagnosis not present

## 2018-01-02 DIAGNOSIS — M2550 Pain in unspecified joint: Secondary | ICD-10-CM | POA: Diagnosis not present

## 2018-01-02 DIAGNOSIS — M8580 Other specified disorders of bone density and structure, unspecified site: Secondary | ICD-10-CM | POA: Diagnosis not present

## 2018-01-02 DIAGNOSIS — M0609 Rheumatoid arthritis without rheumatoid factor, multiple sites: Secondary | ICD-10-CM | POA: Diagnosis not present

## 2018-01-06 DIAGNOSIS — D72819 Decreased white blood cell count, unspecified: Secondary | ICD-10-CM | POA: Diagnosis not present

## 2018-02-24 DIAGNOSIS — E782 Mixed hyperlipidemia: Secondary | ICD-10-CM | POA: Diagnosis not present

## 2018-02-24 DIAGNOSIS — I10 Essential (primary) hypertension: Secondary | ICD-10-CM | POA: Diagnosis not present

## 2018-02-24 DIAGNOSIS — E039 Hypothyroidism, unspecified: Secondary | ICD-10-CM | POA: Diagnosis not present

## 2018-02-24 DIAGNOSIS — E1169 Type 2 diabetes mellitus with other specified complication: Secondary | ICD-10-CM | POA: Diagnosis not present

## 2018-02-24 DIAGNOSIS — E559 Vitamin D deficiency, unspecified: Secondary | ICD-10-CM | POA: Diagnosis not present

## 2018-04-01 DIAGNOSIS — M0609 Rheumatoid arthritis without rheumatoid factor, multiple sites: Secondary | ICD-10-CM | POA: Diagnosis not present

## 2018-04-01 DIAGNOSIS — M2550 Pain in unspecified joint: Secondary | ICD-10-CM | POA: Diagnosis not present

## 2018-04-01 DIAGNOSIS — Z79899 Other long term (current) drug therapy: Secondary | ICD-10-CM | POA: Diagnosis not present

## 2018-05-05 DIAGNOSIS — H52223 Regular astigmatism, bilateral: Secondary | ICD-10-CM | POA: Diagnosis not present

## 2018-05-27 DIAGNOSIS — I10 Essential (primary) hypertension: Secondary | ICD-10-CM | POA: Diagnosis not present

## 2018-05-27 DIAGNOSIS — M8580 Other specified disorders of bone density and structure, unspecified site: Secondary | ICD-10-CM | POA: Diagnosis not present

## 2018-05-27 DIAGNOSIS — E1169 Type 2 diabetes mellitus with other specified complication: Secondary | ICD-10-CM | POA: Diagnosis not present

## 2018-05-27 DIAGNOSIS — E559 Vitamin D deficiency, unspecified: Secondary | ICD-10-CM | POA: Diagnosis not present

## 2018-05-27 DIAGNOSIS — M0609 Rheumatoid arthritis without rheumatoid factor, multiple sites: Secondary | ICD-10-CM | POA: Diagnosis not present

## 2018-05-27 DIAGNOSIS — E039 Hypothyroidism, unspecified: Secondary | ICD-10-CM | POA: Diagnosis not present

## 2018-05-27 DIAGNOSIS — Z1339 Encounter for screening examination for other mental health and behavioral disorders: Secondary | ICD-10-CM | POA: Diagnosis not present

## 2018-06-05 DIAGNOSIS — R69 Illness, unspecified: Secondary | ICD-10-CM | POA: Diagnosis not present

## 2018-06-10 DIAGNOSIS — H40033 Anatomical narrow angle, bilateral: Secondary | ICD-10-CM | POA: Diagnosis not present

## 2018-06-10 DIAGNOSIS — H2511 Age-related nuclear cataract, right eye: Secondary | ICD-10-CM | POA: Diagnosis not present

## 2018-06-16 DIAGNOSIS — E119 Type 2 diabetes mellitus without complications: Secondary | ICD-10-CM | POA: Diagnosis not present

## 2018-06-19 DIAGNOSIS — H5709 Other anomalies of pupillary function: Secondary | ICD-10-CM | POA: Diagnosis not present

## 2018-06-19 DIAGNOSIS — H25811 Combined forms of age-related cataract, right eye: Secondary | ICD-10-CM | POA: Diagnosis not present

## 2018-06-19 DIAGNOSIS — H2511 Age-related nuclear cataract, right eye: Secondary | ICD-10-CM | POA: Diagnosis not present

## 2018-07-03 DIAGNOSIS — Z79899 Other long term (current) drug therapy: Secondary | ICD-10-CM | POA: Diagnosis not present

## 2018-07-03 DIAGNOSIS — M0609 Rheumatoid arthritis without rheumatoid factor, multiple sites: Secondary | ICD-10-CM | POA: Diagnosis not present

## 2018-07-03 DIAGNOSIS — M2550 Pain in unspecified joint: Secondary | ICD-10-CM | POA: Diagnosis not present

## 2018-07-08 DIAGNOSIS — H2512 Age-related nuclear cataract, left eye: Secondary | ICD-10-CM | POA: Diagnosis not present

## 2018-07-08 DIAGNOSIS — Z01818 Encounter for other preprocedural examination: Secondary | ICD-10-CM | POA: Diagnosis not present

## 2018-07-14 DIAGNOSIS — E119 Type 2 diabetes mellitus without complications: Secondary | ICD-10-CM | POA: Diagnosis not present

## 2018-07-16 DIAGNOSIS — H25812 Combined forms of age-related cataract, left eye: Secondary | ICD-10-CM | POA: Diagnosis not present

## 2018-07-16 DIAGNOSIS — H2512 Age-related nuclear cataract, left eye: Secondary | ICD-10-CM | POA: Diagnosis not present

## 2018-07-16 DIAGNOSIS — H5709 Other anomalies of pupillary function: Secondary | ICD-10-CM | POA: Diagnosis not present

## 2018-09-02 DIAGNOSIS — R69 Illness, unspecified: Secondary | ICD-10-CM | POA: Diagnosis not present

## 2018-09-08 DIAGNOSIS — E1169 Type 2 diabetes mellitus with other specified complication: Secondary | ICD-10-CM | POA: Diagnosis not present

## 2018-09-08 DIAGNOSIS — E559 Vitamin D deficiency, unspecified: Secondary | ICD-10-CM | POA: Diagnosis not present

## 2018-09-08 DIAGNOSIS — E039 Hypothyroidism, unspecified: Secondary | ICD-10-CM | POA: Diagnosis not present

## 2018-09-08 DIAGNOSIS — Z23 Encounter for immunization: Secondary | ICD-10-CM | POA: Diagnosis not present

## 2018-09-08 DIAGNOSIS — M8580 Other specified disorders of bone density and structure, unspecified site: Secondary | ICD-10-CM | POA: Diagnosis not present

## 2018-09-08 DIAGNOSIS — I10 Essential (primary) hypertension: Secondary | ICD-10-CM | POA: Diagnosis not present

## 2018-09-08 DIAGNOSIS — M0609 Rheumatoid arthritis without rheumatoid factor, multiple sites: Secondary | ICD-10-CM | POA: Diagnosis not present

## 2018-10-03 DIAGNOSIS — Z79899 Other long term (current) drug therapy: Secondary | ICD-10-CM | POA: Diagnosis not present

## 2018-10-03 DIAGNOSIS — M2550 Pain in unspecified joint: Secondary | ICD-10-CM | POA: Diagnosis not present

## 2018-10-03 DIAGNOSIS — M8580 Other specified disorders of bone density and structure, unspecified site: Secondary | ICD-10-CM | POA: Diagnosis not present

## 2018-10-03 DIAGNOSIS — M0609 Rheumatoid arthritis without rheumatoid factor, multiple sites: Secondary | ICD-10-CM | POA: Diagnosis not present

## 2018-10-22 DIAGNOSIS — E039 Hypothyroidism, unspecified: Secondary | ICD-10-CM | POA: Diagnosis not present

## 2019-01-05 DIAGNOSIS — M0609 Rheumatoid arthritis without rheumatoid factor, multiple sites: Secondary | ICD-10-CM | POA: Diagnosis not present

## 2019-01-05 DIAGNOSIS — Z79899 Other long term (current) drug therapy: Secondary | ICD-10-CM | POA: Diagnosis not present

## 2019-01-05 DIAGNOSIS — M8580 Other specified disorders of bone density and structure, unspecified site: Secondary | ICD-10-CM | POA: Diagnosis not present

## 2019-01-05 DIAGNOSIS — M2550 Pain in unspecified joint: Secondary | ICD-10-CM | POA: Diagnosis not present

## 2019-01-12 DIAGNOSIS — I10 Essential (primary) hypertension: Secondary | ICD-10-CM | POA: Diagnosis not present

## 2019-01-12 DIAGNOSIS — E559 Vitamin D deficiency, unspecified: Secondary | ICD-10-CM | POA: Diagnosis not present

## 2019-01-12 DIAGNOSIS — M0609 Rheumatoid arthritis without rheumatoid factor, multiple sites: Secondary | ICD-10-CM | POA: Diagnosis not present

## 2019-01-12 DIAGNOSIS — E1169 Type 2 diabetes mellitus with other specified complication: Secondary | ICD-10-CM | POA: Diagnosis not present

## 2019-01-12 DIAGNOSIS — E039 Hypothyroidism, unspecified: Secondary | ICD-10-CM | POA: Diagnosis not present

## 2019-01-12 DIAGNOSIS — Z1331 Encounter for screening for depression: Secondary | ICD-10-CM | POA: Diagnosis not present

## 2019-01-12 DIAGNOSIS — M8580 Other specified disorders of bone density and structure, unspecified site: Secondary | ICD-10-CM | POA: Diagnosis not present

## 2019-01-12 DIAGNOSIS — Z9181 History of falling: Secondary | ICD-10-CM | POA: Diagnosis not present

## 2019-01-14 DIAGNOSIS — R69 Illness, unspecified: Secondary | ICD-10-CM | POA: Diagnosis not present

## 2019-02-19 DIAGNOSIS — E1169 Type 2 diabetes mellitus with other specified complication: Secondary | ICD-10-CM | POA: Diagnosis not present

## 2019-04-06 DIAGNOSIS — Z79899 Other long term (current) drug therapy: Secondary | ICD-10-CM | POA: Diagnosis not present

## 2019-04-06 DIAGNOSIS — M0609 Rheumatoid arthritis without rheumatoid factor, multiple sites: Secondary | ICD-10-CM | POA: Diagnosis not present

## 2019-04-06 DIAGNOSIS — M2550 Pain in unspecified joint: Secondary | ICD-10-CM | POA: Diagnosis not present

## 2019-04-06 DIAGNOSIS — M8580 Other specified disorders of bone density and structure, unspecified site: Secondary | ICD-10-CM | POA: Diagnosis not present

## 2019-04-15 DIAGNOSIS — E1169 Type 2 diabetes mellitus with other specified complication: Secondary | ICD-10-CM | POA: Diagnosis not present

## 2019-04-15 DIAGNOSIS — E039 Hypothyroidism, unspecified: Secondary | ICD-10-CM | POA: Diagnosis not present

## 2019-04-15 DIAGNOSIS — E559 Vitamin D deficiency, unspecified: Secondary | ICD-10-CM | POA: Diagnosis not present

## 2019-04-15 DIAGNOSIS — I10 Essential (primary) hypertension: Secondary | ICD-10-CM | POA: Diagnosis not present

## 2019-04-15 DIAGNOSIS — Z1231 Encounter for screening mammogram for malignant neoplasm of breast: Secondary | ICD-10-CM | POA: Diagnosis not present

## 2019-04-15 DIAGNOSIS — M8580 Other specified disorders of bone density and structure, unspecified site: Secondary | ICD-10-CM | POA: Diagnosis not present

## 2019-04-15 DIAGNOSIS — E669 Obesity, unspecified: Secondary | ICD-10-CM | POA: Diagnosis not present

## 2019-04-15 DIAGNOSIS — M0609 Rheumatoid arthritis without rheumatoid factor, multiple sites: Secondary | ICD-10-CM | POA: Diagnosis not present

## 2019-06-03 DIAGNOSIS — R69 Illness, unspecified: Secondary | ICD-10-CM | POA: Diagnosis not present

## 2019-07-06 DIAGNOSIS — E113293 Type 2 diabetes mellitus with mild nonproliferative diabetic retinopathy without macular edema, bilateral: Secondary | ICD-10-CM | POA: Diagnosis not present

## 2019-07-06 DIAGNOSIS — H3562 Retinal hemorrhage, left eye: Secondary | ICD-10-CM | POA: Diagnosis not present

## 2019-07-06 DIAGNOSIS — H52223 Regular astigmatism, bilateral: Secondary | ICD-10-CM | POA: Diagnosis not present

## 2019-07-07 DIAGNOSIS — Z79899 Other long term (current) drug therapy: Secondary | ICD-10-CM | POA: Diagnosis not present

## 2019-07-07 DIAGNOSIS — M8580 Other specified disorders of bone density and structure, unspecified site: Secondary | ICD-10-CM | POA: Diagnosis not present

## 2019-07-07 DIAGNOSIS — M0609 Rheumatoid arthritis without rheumatoid factor, multiple sites: Secondary | ICD-10-CM | POA: Diagnosis not present

## 2019-08-04 DIAGNOSIS — Z1331 Encounter for screening for depression: Secondary | ICD-10-CM | POA: Diagnosis not present

## 2019-08-04 DIAGNOSIS — Z1231 Encounter for screening mammogram for malignant neoplasm of breast: Secondary | ICD-10-CM | POA: Diagnosis not present

## 2019-08-04 DIAGNOSIS — Z139 Encounter for screening, unspecified: Secondary | ICD-10-CM | POA: Diagnosis not present

## 2019-08-04 DIAGNOSIS — Z0000 Encounter for general adult medical examination without abnormal findings: Secondary | ICD-10-CM | POA: Diagnosis not present

## 2019-08-04 DIAGNOSIS — Z6831 Body mass index (BMI) 31.0-31.9, adult: Secondary | ICD-10-CM | POA: Diagnosis not present

## 2019-08-04 DIAGNOSIS — Z9181 History of falling: Secondary | ICD-10-CM | POA: Diagnosis not present

## 2019-08-04 DIAGNOSIS — E785 Hyperlipidemia, unspecified: Secondary | ICD-10-CM | POA: Diagnosis not present

## 2019-08-20 DIAGNOSIS — Z1231 Encounter for screening mammogram for malignant neoplasm of breast: Secondary | ICD-10-CM | POA: Diagnosis not present

## 2019-08-24 DIAGNOSIS — I10 Essential (primary) hypertension: Secondary | ICD-10-CM | POA: Diagnosis not present

## 2019-08-24 DIAGNOSIS — M0609 Rheumatoid arthritis without rheumatoid factor, multiple sites: Secondary | ICD-10-CM | POA: Diagnosis not present

## 2019-08-24 DIAGNOSIS — E669 Obesity, unspecified: Secondary | ICD-10-CM | POA: Diagnosis not present

## 2019-08-24 DIAGNOSIS — E559 Vitamin D deficiency, unspecified: Secondary | ICD-10-CM | POA: Diagnosis not present

## 2019-08-24 DIAGNOSIS — E039 Hypothyroidism, unspecified: Secondary | ICD-10-CM | POA: Diagnosis not present

## 2019-08-24 DIAGNOSIS — M8580 Other specified disorders of bone density and structure, unspecified site: Secondary | ICD-10-CM | POA: Diagnosis not present

## 2019-08-24 DIAGNOSIS — E782 Mixed hyperlipidemia: Secondary | ICD-10-CM | POA: Diagnosis not present

## 2019-08-24 DIAGNOSIS — Z6830 Body mass index (BMI) 30.0-30.9, adult: Secondary | ICD-10-CM | POA: Diagnosis not present

## 2019-08-24 DIAGNOSIS — E1169 Type 2 diabetes mellitus with other specified complication: Secondary | ICD-10-CM | POA: Diagnosis not present

## 2019-08-24 DIAGNOSIS — Z23 Encounter for immunization: Secondary | ICD-10-CM | POA: Diagnosis not present

## 2019-11-27 DIAGNOSIS — E669 Obesity, unspecified: Secondary | ICD-10-CM | POA: Diagnosis not present

## 2019-11-27 DIAGNOSIS — E782 Mixed hyperlipidemia: Secondary | ICD-10-CM | POA: Diagnosis not present

## 2019-11-27 DIAGNOSIS — I10 Essential (primary) hypertension: Secondary | ICD-10-CM | POA: Diagnosis not present

## 2019-11-27 DIAGNOSIS — E1169 Type 2 diabetes mellitus with other specified complication: Secondary | ICD-10-CM | POA: Diagnosis not present

## 2019-11-27 DIAGNOSIS — E039 Hypothyroidism, unspecified: Secondary | ICD-10-CM | POA: Diagnosis not present

## 2019-11-27 DIAGNOSIS — E559 Vitamin D deficiency, unspecified: Secondary | ICD-10-CM | POA: Diagnosis not present

## 2019-11-27 DIAGNOSIS — M2550 Pain in unspecified joint: Secondary | ICD-10-CM | POA: Diagnosis not present

## 2019-11-27 DIAGNOSIS — R05 Cough: Secondary | ICD-10-CM | POA: Diagnosis not present

## 2019-11-27 DIAGNOSIS — M8580 Other specified disorders of bone density and structure, unspecified site: Secondary | ICD-10-CM | POA: Diagnosis not present

## 2019-11-27 DIAGNOSIS — Z6830 Body mass index (BMI) 30.0-30.9, adult: Secondary | ICD-10-CM | POA: Diagnosis not present

## 2019-11-27 DIAGNOSIS — M0609 Rheumatoid arthritis without rheumatoid factor, multiple sites: Secondary | ICD-10-CM | POA: Diagnosis not present

## 2019-11-27 DIAGNOSIS — Z79899 Other long term (current) drug therapy: Secondary | ICD-10-CM | POA: Diagnosis not present

## 2020-02-02 DIAGNOSIS — R69 Illness, unspecified: Secondary | ICD-10-CM | POA: Diagnosis not present

## 2020-02-25 DIAGNOSIS — Z20822 Contact with and (suspected) exposure to covid-19: Secondary | ICD-10-CM | POA: Diagnosis not present

## 2020-03-02 DIAGNOSIS — U071 COVID-19: Secondary | ICD-10-CM | POA: Diagnosis not present

## 2020-03-02 DIAGNOSIS — R55 Syncope and collapse: Secondary | ICD-10-CM | POA: Diagnosis not present

## 2020-03-02 DIAGNOSIS — J9811 Atelectasis: Secondary | ICD-10-CM | POA: Diagnosis not present

## 2020-03-02 DIAGNOSIS — E860 Dehydration: Secondary | ICD-10-CM | POA: Diagnosis not present

## 2020-03-04 DIAGNOSIS — N390 Urinary tract infection, site not specified: Secondary | ICD-10-CM | POA: Diagnosis not present

## 2020-03-04 DIAGNOSIS — U071 COVID-19: Secondary | ICD-10-CM | POA: Diagnosis not present

## 2020-03-04 DIAGNOSIS — E039 Hypothyroidism, unspecified: Secondary | ICD-10-CM | POA: Diagnosis not present

## 2020-03-04 DIAGNOSIS — E871 Hypo-osmolality and hyponatremia: Secondary | ICD-10-CM | POA: Diagnosis not present

## 2020-03-04 DIAGNOSIS — R0602 Shortness of breath: Secondary | ICD-10-CM | POA: Diagnosis not present

## 2020-03-04 DIAGNOSIS — R918 Other nonspecific abnormal finding of lung field: Secondary | ICD-10-CM | POA: Diagnosis not present

## 2020-03-04 DIAGNOSIS — I10 Essential (primary) hypertension: Secondary | ICD-10-CM | POA: Diagnosis not present

## 2020-03-04 DIAGNOSIS — R5383 Other fatigue: Secondary | ICD-10-CM | POA: Diagnosis not present

## 2020-03-04 DIAGNOSIS — R197 Diarrhea, unspecified: Secondary | ICD-10-CM | POA: Diagnosis not present

## 2020-03-04 DIAGNOSIS — B9689 Other specified bacterial agents as the cause of diseases classified elsewhere: Secondary | ICD-10-CM | POA: Diagnosis not present

## 2020-03-04 DIAGNOSIS — J1282 Pneumonia due to coronavirus disease 2019: Secondary | ICD-10-CM | POA: Diagnosis not present

## 2020-03-04 DIAGNOSIS — R05 Cough: Secondary | ICD-10-CM | POA: Diagnosis not present

## 2020-03-04 DIAGNOSIS — D72819 Decreased white blood cell count, unspecified: Secondary | ICD-10-CM | POA: Diagnosis not present

## 2020-03-04 DIAGNOSIS — Z209 Contact with and (suspected) exposure to unspecified communicable disease: Secondary | ICD-10-CM | POA: Diagnosis not present

## 2020-03-04 DIAGNOSIS — R531 Weakness: Secondary | ICD-10-CM | POA: Diagnosis not present

## 2020-03-04 DIAGNOSIS — M069 Rheumatoid arthritis, unspecified: Secondary | ICD-10-CM | POA: Diagnosis not present

## 2020-03-04 DIAGNOSIS — E119 Type 2 diabetes mellitus without complications: Secondary | ICD-10-CM | POA: Diagnosis not present

## 2020-03-04 DIAGNOSIS — J9601 Acute respiratory failure with hypoxia: Secondary | ICD-10-CM | POA: Diagnosis not present

## 2020-03-04 DIAGNOSIS — E785 Hyperlipidemia, unspecified: Secondary | ICD-10-CM | POA: Diagnosis not present

## 2020-03-05 DIAGNOSIS — M069 Rheumatoid arthritis, unspecified: Secondary | ICD-10-CM | POA: Diagnosis not present

## 2020-03-05 DIAGNOSIS — D72819 Decreased white blood cell count, unspecified: Secondary | ICD-10-CM | POA: Diagnosis not present

## 2020-03-05 DIAGNOSIS — I10 Essential (primary) hypertension: Secondary | ICD-10-CM | POA: Diagnosis not present

## 2020-03-05 DIAGNOSIS — E119 Type 2 diabetes mellitus without complications: Secondary | ICD-10-CM | POA: Diagnosis not present

## 2020-03-06 DIAGNOSIS — M069 Rheumatoid arthritis, unspecified: Secondary | ICD-10-CM | POA: Diagnosis not present

## 2020-03-06 DIAGNOSIS — I10 Essential (primary) hypertension: Secondary | ICD-10-CM | POA: Diagnosis not present

## 2020-03-06 DIAGNOSIS — E119 Type 2 diabetes mellitus without complications: Secondary | ICD-10-CM | POA: Diagnosis not present

## 2020-03-06 DIAGNOSIS — D72819 Decreased white blood cell count, unspecified: Secondary | ICD-10-CM | POA: Diagnosis not present

## 2020-03-07 DIAGNOSIS — D72819 Decreased white blood cell count, unspecified: Secondary | ICD-10-CM | POA: Diagnosis not present

## 2020-03-07 DIAGNOSIS — I10 Essential (primary) hypertension: Secondary | ICD-10-CM | POA: Diagnosis not present

## 2020-03-07 DIAGNOSIS — E119 Type 2 diabetes mellitus without complications: Secondary | ICD-10-CM | POA: Diagnosis not present

## 2020-03-07 DIAGNOSIS — M069 Rheumatoid arthritis, unspecified: Secondary | ICD-10-CM | POA: Diagnosis not present

## 2020-03-08 DIAGNOSIS — D72819 Decreased white blood cell count, unspecified: Secondary | ICD-10-CM | POA: Diagnosis not present

## 2020-03-08 DIAGNOSIS — E119 Type 2 diabetes mellitus without complications: Secondary | ICD-10-CM | POA: Diagnosis not present

## 2020-03-08 DIAGNOSIS — M069 Rheumatoid arthritis, unspecified: Secondary | ICD-10-CM | POA: Diagnosis not present

## 2020-03-08 DIAGNOSIS — I10 Essential (primary) hypertension: Secondary | ICD-10-CM | POA: Diagnosis not present

## 2020-03-10 DIAGNOSIS — J1282 Pneumonia due to coronavirus disease 2019: Secondary | ICD-10-CM | POA: Diagnosis not present

## 2020-03-10 DIAGNOSIS — M069 Rheumatoid arthritis, unspecified: Secondary | ICD-10-CM | POA: Diagnosis not present

## 2020-03-10 DIAGNOSIS — Z79899 Other long term (current) drug therapy: Secondary | ICD-10-CM | POA: Diagnosis not present

## 2020-03-10 DIAGNOSIS — Z7984 Long term (current) use of oral hypoglycemic drugs: Secondary | ICD-10-CM | POA: Diagnosis not present

## 2020-03-10 DIAGNOSIS — Z87891 Personal history of nicotine dependence: Secondary | ICD-10-CM | POA: Diagnosis not present

## 2020-03-10 DIAGNOSIS — E119 Type 2 diabetes mellitus without complications: Secondary | ICD-10-CM | POA: Diagnosis not present

## 2020-03-10 DIAGNOSIS — E039 Hypothyroidism, unspecified: Secondary | ICD-10-CM | POA: Diagnosis not present

## 2020-03-10 DIAGNOSIS — I10 Essential (primary) hypertension: Secondary | ICD-10-CM | POA: Diagnosis not present

## 2020-03-10 DIAGNOSIS — U071 COVID-19: Secondary | ICD-10-CM | POA: Diagnosis not present

## 2020-03-10 DIAGNOSIS — D649 Anemia, unspecified: Secondary | ICD-10-CM | POA: Diagnosis not present

## 2020-03-16 DIAGNOSIS — J1282 Pneumonia due to coronavirus disease 2019: Secondary | ICD-10-CM | POA: Diagnosis not present

## 2020-03-16 DIAGNOSIS — U071 COVID-19: Secondary | ICD-10-CM | POA: Diagnosis not present

## 2020-03-16 DIAGNOSIS — Z6830 Body mass index (BMI) 30.0-30.9, adult: Secondary | ICD-10-CM | POA: Diagnosis not present

## 2020-03-16 DIAGNOSIS — Z7984 Long term (current) use of oral hypoglycemic drugs: Secondary | ICD-10-CM | POA: Diagnosis not present

## 2020-03-16 DIAGNOSIS — Z87891 Personal history of nicotine dependence: Secondary | ICD-10-CM | POA: Diagnosis not present

## 2020-03-16 DIAGNOSIS — N390 Urinary tract infection, site not specified: Secondary | ICD-10-CM | POA: Diagnosis not present

## 2020-03-16 DIAGNOSIS — E782 Mixed hyperlipidemia: Secondary | ICD-10-CM | POA: Diagnosis not present

## 2020-03-16 DIAGNOSIS — E1169 Type 2 diabetes mellitus with other specified complication: Secondary | ICD-10-CM | POA: Diagnosis not present

## 2020-03-16 DIAGNOSIS — I10 Essential (primary) hypertension: Secondary | ICD-10-CM | POA: Diagnosis not present

## 2020-03-16 DIAGNOSIS — Z7689 Persons encountering health services in other specified circumstances: Secondary | ICD-10-CM | POA: Diagnosis not present

## 2020-03-16 DIAGNOSIS — J129 Viral pneumonia, unspecified: Secondary | ICD-10-CM | POA: Diagnosis not present

## 2020-03-16 DIAGNOSIS — E1165 Type 2 diabetes mellitus with hyperglycemia: Secondary | ICD-10-CM | POA: Diagnosis not present

## 2020-03-16 DIAGNOSIS — M069 Rheumatoid arthritis, unspecified: Secondary | ICD-10-CM | POA: Diagnosis not present

## 2020-03-16 DIAGNOSIS — E119 Type 2 diabetes mellitus without complications: Secondary | ICD-10-CM | POA: Diagnosis not present

## 2020-03-16 DIAGNOSIS — D649 Anemia, unspecified: Secondary | ICD-10-CM | POA: Diagnosis not present

## 2020-03-16 DIAGNOSIS — E039 Hypothyroidism, unspecified: Secondary | ICD-10-CM | POA: Diagnosis not present

## 2020-03-16 DIAGNOSIS — Z79899 Other long term (current) drug therapy: Secondary | ICD-10-CM | POA: Diagnosis not present

## 2020-03-24 DIAGNOSIS — E039 Hypothyroidism, unspecified: Secondary | ICD-10-CM | POA: Diagnosis not present

## 2020-03-24 DIAGNOSIS — D649 Anemia, unspecified: Secondary | ICD-10-CM | POA: Diagnosis not present

## 2020-03-24 DIAGNOSIS — M069 Rheumatoid arthritis, unspecified: Secondary | ICD-10-CM | POA: Diagnosis not present

## 2020-03-24 DIAGNOSIS — U071 COVID-19: Secondary | ICD-10-CM | POA: Diagnosis not present

## 2020-03-24 DIAGNOSIS — E119 Type 2 diabetes mellitus without complications: Secondary | ICD-10-CM | POA: Diagnosis not present

## 2020-03-24 DIAGNOSIS — Z7984 Long term (current) use of oral hypoglycemic drugs: Secondary | ICD-10-CM | POA: Diagnosis not present

## 2020-03-24 DIAGNOSIS — I10 Essential (primary) hypertension: Secondary | ICD-10-CM | POA: Diagnosis not present

## 2020-03-24 DIAGNOSIS — Z79899 Other long term (current) drug therapy: Secondary | ICD-10-CM | POA: Diagnosis not present

## 2020-03-24 DIAGNOSIS — J1282 Pneumonia due to coronavirus disease 2019: Secondary | ICD-10-CM | POA: Diagnosis not present

## 2020-03-24 DIAGNOSIS — Z87891 Personal history of nicotine dependence: Secondary | ICD-10-CM | POA: Diagnosis not present

## 2020-03-30 DIAGNOSIS — N390 Urinary tract infection, site not specified: Secondary | ICD-10-CM | POA: Diagnosis not present

## 2020-03-30 DIAGNOSIS — E1169 Type 2 diabetes mellitus with other specified complication: Secondary | ICD-10-CM | POA: Diagnosis not present

## 2020-03-30 DIAGNOSIS — E782 Mixed hyperlipidemia: Secondary | ICD-10-CM | POA: Diagnosis not present

## 2020-03-30 DIAGNOSIS — E1165 Type 2 diabetes mellitus with hyperglycemia: Secondary | ICD-10-CM | POA: Diagnosis not present

## 2020-03-30 DIAGNOSIS — Z6829 Body mass index (BMI) 29.0-29.9, adult: Secondary | ICD-10-CM | POA: Diagnosis not present

## 2020-03-30 DIAGNOSIS — R509 Fever, unspecified: Secondary | ICD-10-CM | POA: Diagnosis not present

## 2020-03-30 DIAGNOSIS — M0609 Rheumatoid arthritis without rheumatoid factor, multiple sites: Secondary | ICD-10-CM | POA: Diagnosis not present

## 2020-03-31 DIAGNOSIS — I10 Essential (primary) hypertension: Secondary | ICD-10-CM | POA: Diagnosis not present

## 2020-03-31 DIAGNOSIS — U071 COVID-19: Secondary | ICD-10-CM | POA: Diagnosis not present

## 2020-03-31 DIAGNOSIS — D649 Anemia, unspecified: Secondary | ICD-10-CM | POA: Diagnosis not present

## 2020-03-31 DIAGNOSIS — Z79899 Other long term (current) drug therapy: Secondary | ICD-10-CM | POA: Diagnosis not present

## 2020-03-31 DIAGNOSIS — E039 Hypothyroidism, unspecified: Secondary | ICD-10-CM | POA: Diagnosis not present

## 2020-03-31 DIAGNOSIS — Z87891 Personal history of nicotine dependence: Secondary | ICD-10-CM | POA: Diagnosis not present

## 2020-03-31 DIAGNOSIS — E119 Type 2 diabetes mellitus without complications: Secondary | ICD-10-CM | POA: Diagnosis not present

## 2020-03-31 DIAGNOSIS — M069 Rheumatoid arthritis, unspecified: Secondary | ICD-10-CM | POA: Diagnosis not present

## 2020-03-31 DIAGNOSIS — J1282 Pneumonia due to coronavirus disease 2019: Secondary | ICD-10-CM | POA: Diagnosis not present

## 2020-03-31 DIAGNOSIS — Z7984 Long term (current) use of oral hypoglycemic drugs: Secondary | ICD-10-CM | POA: Diagnosis not present

## 2020-04-14 DIAGNOSIS — M069 Rheumatoid arthritis, unspecified: Secondary | ICD-10-CM | POA: Diagnosis not present

## 2020-04-14 DIAGNOSIS — U071 COVID-19: Secondary | ICD-10-CM | POA: Diagnosis not present

## 2020-04-14 DIAGNOSIS — J1282 Pneumonia due to coronavirus disease 2019: Secondary | ICD-10-CM | POA: Diagnosis not present

## 2020-04-14 DIAGNOSIS — Z7984 Long term (current) use of oral hypoglycemic drugs: Secondary | ICD-10-CM | POA: Diagnosis not present

## 2020-04-14 DIAGNOSIS — Z87891 Personal history of nicotine dependence: Secondary | ICD-10-CM | POA: Diagnosis not present

## 2020-04-14 DIAGNOSIS — I10 Essential (primary) hypertension: Secondary | ICD-10-CM | POA: Diagnosis not present

## 2020-04-14 DIAGNOSIS — Z79899 Other long term (current) drug therapy: Secondary | ICD-10-CM | POA: Diagnosis not present

## 2020-04-14 DIAGNOSIS — E119 Type 2 diabetes mellitus without complications: Secondary | ICD-10-CM | POA: Diagnosis not present

## 2020-04-14 DIAGNOSIS — D649 Anemia, unspecified: Secondary | ICD-10-CM | POA: Diagnosis not present

## 2020-04-14 DIAGNOSIS — E039 Hypothyroidism, unspecified: Secondary | ICD-10-CM | POA: Diagnosis not present

## 2020-05-04 DIAGNOSIS — Z87891 Personal history of nicotine dependence: Secondary | ICD-10-CM | POA: Diagnosis not present

## 2020-05-04 DIAGNOSIS — D649 Anemia, unspecified: Secondary | ICD-10-CM | POA: Diagnosis not present

## 2020-05-04 DIAGNOSIS — M069 Rheumatoid arthritis, unspecified: Secondary | ICD-10-CM | POA: Diagnosis not present

## 2020-05-04 DIAGNOSIS — E119 Type 2 diabetes mellitus without complications: Secondary | ICD-10-CM | POA: Diagnosis not present

## 2020-05-04 DIAGNOSIS — E039 Hypothyroidism, unspecified: Secondary | ICD-10-CM | POA: Diagnosis not present

## 2020-05-04 DIAGNOSIS — I10 Essential (primary) hypertension: Secondary | ICD-10-CM | POA: Diagnosis not present

## 2020-05-04 DIAGNOSIS — Z7984 Long term (current) use of oral hypoglycemic drugs: Secondary | ICD-10-CM | POA: Diagnosis not present

## 2020-05-04 DIAGNOSIS — U071 COVID-19: Secondary | ICD-10-CM | POA: Diagnosis not present

## 2020-05-04 DIAGNOSIS — J1282 Pneumonia due to coronavirus disease 2019: Secondary | ICD-10-CM | POA: Diagnosis not present

## 2020-05-04 DIAGNOSIS — Z79899 Other long term (current) drug therapy: Secondary | ICD-10-CM | POA: Diagnosis not present

## 2020-05-27 DIAGNOSIS — M8580 Other specified disorders of bone density and structure, unspecified site: Secondary | ICD-10-CM | POA: Diagnosis not present

## 2020-05-27 DIAGNOSIS — Z79899 Other long term (current) drug therapy: Secondary | ICD-10-CM | POA: Diagnosis not present

## 2020-05-27 DIAGNOSIS — M2550 Pain in unspecified joint: Secondary | ICD-10-CM | POA: Diagnosis not present

## 2020-05-27 DIAGNOSIS — M0609 Rheumatoid arthritis without rheumatoid factor, multiple sites: Secondary | ICD-10-CM | POA: Diagnosis not present

## 2020-06-12 DIAGNOSIS — R69 Illness, unspecified: Secondary | ICD-10-CM | POA: Diagnosis not present

## 2020-12-06 DIAGNOSIS — E669 Obesity, unspecified: Secondary | ICD-10-CM | POA: Diagnosis not present

## 2020-12-06 DIAGNOSIS — Z9181 History of falling: Secondary | ICD-10-CM | POA: Diagnosis not present

## 2020-12-06 DIAGNOSIS — Z0000 Encounter for general adult medical examination without abnormal findings: Secondary | ICD-10-CM | POA: Diagnosis not present

## 2020-12-06 DIAGNOSIS — Z1331 Encounter for screening for depression: Secondary | ICD-10-CM | POA: Diagnosis not present

## 2020-12-06 DIAGNOSIS — E785 Hyperlipidemia, unspecified: Secondary | ICD-10-CM | POA: Diagnosis not present

## 2020-12-09 DIAGNOSIS — E782 Mixed hyperlipidemia: Secondary | ICD-10-CM | POA: Diagnosis not present

## 2020-12-09 DIAGNOSIS — M0609 Rheumatoid arthritis without rheumatoid factor, multiple sites: Secondary | ICD-10-CM | POA: Diagnosis not present

## 2020-12-09 DIAGNOSIS — E1169 Type 2 diabetes mellitus with other specified complication: Secondary | ICD-10-CM | POA: Diagnosis not present

## 2020-12-09 DIAGNOSIS — Z6830 Body mass index (BMI) 30.0-30.9, adult: Secondary | ICD-10-CM | POA: Diagnosis not present

## 2020-12-09 DIAGNOSIS — E1165 Type 2 diabetes mellitus with hyperglycemia: Secondary | ICD-10-CM | POA: Diagnosis not present

## 2020-12-09 DIAGNOSIS — E039 Hypothyroidism, unspecified: Secondary | ICD-10-CM | POA: Diagnosis not present

## 2020-12-12 DIAGNOSIS — M0609 Rheumatoid arthritis without rheumatoid factor, multiple sites: Secondary | ICD-10-CM | POA: Diagnosis not present

## 2020-12-12 DIAGNOSIS — M8580 Other specified disorders of bone density and structure, unspecified site: Secondary | ICD-10-CM | POA: Diagnosis not present

## 2020-12-12 DIAGNOSIS — M2550 Pain in unspecified joint: Secondary | ICD-10-CM | POA: Diagnosis not present

## 2020-12-12 DIAGNOSIS — Z79899 Other long term (current) drug therapy: Secondary | ICD-10-CM | POA: Diagnosis not present

## 2021-01-06 DIAGNOSIS — M85852 Other specified disorders of bone density and structure, left thigh: Secondary | ICD-10-CM | POA: Diagnosis not present

## 2021-01-06 DIAGNOSIS — Z1231 Encounter for screening mammogram for malignant neoplasm of breast: Secondary | ICD-10-CM | POA: Diagnosis not present

## 2021-01-06 DIAGNOSIS — N959 Unspecified menopausal and perimenopausal disorder: Secondary | ICD-10-CM | POA: Diagnosis not present

## 2021-02-23 DIAGNOSIS — H52223 Regular astigmatism, bilateral: Secondary | ICD-10-CM | POA: Diagnosis not present

## 2021-02-23 DIAGNOSIS — H524 Presbyopia: Secondary | ICD-10-CM | POA: Diagnosis not present

## 2021-02-23 DIAGNOSIS — Z7984 Long term (current) use of oral hypoglycemic drugs: Secondary | ICD-10-CM | POA: Diagnosis not present

## 2021-02-23 DIAGNOSIS — H43811 Vitreous degeneration, right eye: Secondary | ICD-10-CM | POA: Diagnosis not present

## 2021-02-23 DIAGNOSIS — E113293 Type 2 diabetes mellitus with mild nonproliferative diabetic retinopathy without macular edema, bilateral: Secondary | ICD-10-CM | POA: Diagnosis not present

## 2021-02-23 DIAGNOSIS — Z961 Presence of intraocular lens: Secondary | ICD-10-CM | POA: Diagnosis not present

## 2021-02-23 DIAGNOSIS — Z794 Long term (current) use of insulin: Secondary | ICD-10-CM | POA: Diagnosis not present

## 2021-03-13 DIAGNOSIS — Z79899 Other long term (current) drug therapy: Secondary | ICD-10-CM | POA: Diagnosis not present

## 2021-03-13 DIAGNOSIS — M8580 Other specified disorders of bone density and structure, unspecified site: Secondary | ICD-10-CM | POA: Diagnosis not present

## 2021-03-13 DIAGNOSIS — M0609 Rheumatoid arthritis without rheumatoid factor, multiple sites: Secondary | ICD-10-CM | POA: Diagnosis not present

## 2021-03-13 DIAGNOSIS — M2550 Pain in unspecified joint: Secondary | ICD-10-CM | POA: Diagnosis not present

## 2021-04-10 DIAGNOSIS — Z6832 Body mass index (BMI) 32.0-32.9, adult: Secondary | ICD-10-CM | POA: Diagnosis not present

## 2021-04-10 DIAGNOSIS — E039 Hypothyroidism, unspecified: Secondary | ICD-10-CM | POA: Diagnosis not present

## 2021-04-10 DIAGNOSIS — E1169 Type 2 diabetes mellitus with other specified complication: Secondary | ICD-10-CM | POA: Diagnosis not present

## 2021-04-10 DIAGNOSIS — M0609 Rheumatoid arthritis without rheumatoid factor, multiple sites: Secondary | ICD-10-CM | POA: Diagnosis not present

## 2021-04-10 DIAGNOSIS — E782 Mixed hyperlipidemia: Secondary | ICD-10-CM | POA: Diagnosis not present

## 2021-06-15 DIAGNOSIS — Z79899 Other long term (current) drug therapy: Secondary | ICD-10-CM | POA: Diagnosis not present

## 2021-06-15 DIAGNOSIS — M8580 Other specified disorders of bone density and structure, unspecified site: Secondary | ICD-10-CM | POA: Diagnosis not present

## 2021-06-15 DIAGNOSIS — M0609 Rheumatoid arthritis without rheumatoid factor, multiple sites: Secondary | ICD-10-CM | POA: Diagnosis not present

## 2021-06-15 DIAGNOSIS — M2550 Pain in unspecified joint: Secondary | ICD-10-CM | POA: Diagnosis not present

## 2021-06-25 DIAGNOSIS — U071 COVID-19: Secondary | ICD-10-CM | POA: Diagnosis not present

## 2021-06-25 DIAGNOSIS — R030 Elevated blood-pressure reading, without diagnosis of hypertension: Secondary | ICD-10-CM | POA: Diagnosis not present

## 2021-08-30 DIAGNOSIS — E039 Hypothyroidism, unspecified: Secondary | ICD-10-CM | POA: Diagnosis not present

## 2021-08-30 DIAGNOSIS — E1169 Type 2 diabetes mellitus with other specified complication: Secondary | ICD-10-CM | POA: Diagnosis not present

## 2021-08-30 DIAGNOSIS — Z6832 Body mass index (BMI) 32.0-32.9, adult: Secondary | ICD-10-CM | POA: Diagnosis not present

## 2021-08-30 DIAGNOSIS — Z23 Encounter for immunization: Secondary | ICD-10-CM | POA: Diagnosis not present

## 2021-08-30 DIAGNOSIS — E782 Mixed hyperlipidemia: Secondary | ICD-10-CM | POA: Diagnosis not present

## 2021-08-30 DIAGNOSIS — M0609 Rheumatoid arthritis without rheumatoid factor, multiple sites: Secondary | ICD-10-CM | POA: Diagnosis not present

## 2021-10-16 DIAGNOSIS — M8580 Other specified disorders of bone density and structure, unspecified site: Secondary | ICD-10-CM | POA: Diagnosis not present

## 2021-10-16 DIAGNOSIS — M2550 Pain in unspecified joint: Secondary | ICD-10-CM | POA: Diagnosis not present

## 2021-10-16 DIAGNOSIS — M0609 Rheumatoid arthritis without rheumatoid factor, multiple sites: Secondary | ICD-10-CM | POA: Diagnosis not present

## 2021-10-16 DIAGNOSIS — Z79899 Other long term (current) drug therapy: Secondary | ICD-10-CM | POA: Diagnosis not present

## 2022-01-01 DIAGNOSIS — E559 Vitamin D deficiency, unspecified: Secondary | ICD-10-CM | POA: Diagnosis not present

## 2022-01-01 DIAGNOSIS — E1169 Type 2 diabetes mellitus with other specified complication: Secondary | ICD-10-CM | POA: Diagnosis not present

## 2022-01-01 DIAGNOSIS — M8580 Other specified disorders of bone density and structure, unspecified site: Secondary | ICD-10-CM | POA: Diagnosis not present

## 2022-01-01 DIAGNOSIS — E782 Mixed hyperlipidemia: Secondary | ICD-10-CM | POA: Diagnosis not present

## 2022-01-01 DIAGNOSIS — M0609 Rheumatoid arthritis without rheumatoid factor, multiple sites: Secondary | ICD-10-CM | POA: Diagnosis not present

## 2022-01-01 DIAGNOSIS — Z6833 Body mass index (BMI) 33.0-33.9, adult: Secondary | ICD-10-CM | POA: Diagnosis not present

## 2022-01-01 DIAGNOSIS — E039 Hypothyroidism, unspecified: Secondary | ICD-10-CM | POA: Diagnosis not present

## 2022-01-01 DIAGNOSIS — I10 Essential (primary) hypertension: Secondary | ICD-10-CM | POA: Diagnosis not present

## 2022-01-26 DIAGNOSIS — Z79899 Other long term (current) drug therapy: Secondary | ICD-10-CM | POA: Diagnosis not present

## 2022-01-26 DIAGNOSIS — M2550 Pain in unspecified joint: Secondary | ICD-10-CM | POA: Diagnosis not present

## 2022-01-26 DIAGNOSIS — M0609 Rheumatoid arthritis without rheumatoid factor, multiple sites: Secondary | ICD-10-CM | POA: Diagnosis not present

## 2022-01-26 DIAGNOSIS — M8580 Other specified disorders of bone density and structure, unspecified site: Secondary | ICD-10-CM | POA: Diagnosis not present

## 2022-02-12 DIAGNOSIS — Z1231 Encounter for screening mammogram for malignant neoplasm of breast: Secondary | ICD-10-CM | POA: Diagnosis not present

## 2022-04-02 DIAGNOSIS — I10 Essential (primary) hypertension: Secondary | ICD-10-CM | POA: Diagnosis not present

## 2022-04-02 DIAGNOSIS — E039 Hypothyroidism, unspecified: Secondary | ICD-10-CM | POA: Diagnosis not present

## 2022-04-02 DIAGNOSIS — E1169 Type 2 diabetes mellitus with other specified complication: Secondary | ICD-10-CM | POA: Diagnosis not present

## 2022-04-02 DIAGNOSIS — Z6832 Body mass index (BMI) 32.0-32.9, adult: Secondary | ICD-10-CM | POA: Diagnosis not present

## 2022-04-02 DIAGNOSIS — E782 Mixed hyperlipidemia: Secondary | ICD-10-CM | POA: Diagnosis not present

## 2022-04-02 DIAGNOSIS — E559 Vitamin D deficiency, unspecified: Secondary | ICD-10-CM | POA: Diagnosis not present

## 2022-04-02 DIAGNOSIS — M0609 Rheumatoid arthritis without rheumatoid factor, multiple sites: Secondary | ICD-10-CM | POA: Diagnosis not present

## 2022-04-02 DIAGNOSIS — M8580 Other specified disorders of bone density and structure, unspecified site: Secondary | ICD-10-CM | POA: Diagnosis not present

## 2022-04-23 DIAGNOSIS — M545 Low back pain, unspecified: Secondary | ICD-10-CM | POA: Diagnosis not present

## 2022-04-23 DIAGNOSIS — M25551 Pain in right hip: Secondary | ICD-10-CM | POA: Diagnosis not present

## 2022-04-23 DIAGNOSIS — Z6832 Body mass index (BMI) 32.0-32.9, adult: Secondary | ICD-10-CM | POA: Diagnosis not present

## 2022-05-17 DIAGNOSIS — M5136 Other intervertebral disc degeneration, lumbar region: Secondary | ICD-10-CM | POA: Diagnosis not present

## 2022-05-17 DIAGNOSIS — M545 Low back pain, unspecified: Secondary | ICD-10-CM | POA: Diagnosis not present

## 2022-05-17 DIAGNOSIS — M25551 Pain in right hip: Secondary | ICD-10-CM | POA: Diagnosis not present

## 2022-07-03 DIAGNOSIS — E1169 Type 2 diabetes mellitus with other specified complication: Secondary | ICD-10-CM | POA: Diagnosis not present

## 2022-07-03 DIAGNOSIS — M0609 Rheumatoid arthritis without rheumatoid factor, multiple sites: Secondary | ICD-10-CM | POA: Diagnosis not present

## 2022-07-03 DIAGNOSIS — M8580 Other specified disorders of bone density and structure, unspecified site: Secondary | ICD-10-CM | POA: Diagnosis not present

## 2022-07-03 DIAGNOSIS — E559 Vitamin D deficiency, unspecified: Secondary | ICD-10-CM | POA: Diagnosis not present

## 2022-07-03 DIAGNOSIS — E039 Hypothyroidism, unspecified: Secondary | ICD-10-CM | POA: Diagnosis not present

## 2022-07-03 DIAGNOSIS — Z6832 Body mass index (BMI) 32.0-32.9, adult: Secondary | ICD-10-CM | POA: Diagnosis not present

## 2022-08-24 DIAGNOSIS — Z23 Encounter for immunization: Secondary | ICD-10-CM | POA: Diagnosis not present

## 2022-08-24 DIAGNOSIS — M8580 Other specified disorders of bone density and structure, unspecified site: Secondary | ICD-10-CM | POA: Diagnosis not present

## 2022-08-24 DIAGNOSIS — M0609 Rheumatoid arthritis without rheumatoid factor, multiple sites: Secondary | ICD-10-CM | POA: Diagnosis not present

## 2022-08-24 DIAGNOSIS — Z79899 Other long term (current) drug therapy: Secondary | ICD-10-CM | POA: Diagnosis not present

## 2022-10-02 DIAGNOSIS — E039 Hypothyroidism, unspecified: Secondary | ICD-10-CM | POA: Diagnosis not present

## 2022-10-02 DIAGNOSIS — E1169 Type 2 diabetes mellitus with other specified complication: Secondary | ICD-10-CM | POA: Diagnosis not present

## 2022-10-02 DIAGNOSIS — Z6832 Body mass index (BMI) 32.0-32.9, adult: Secondary | ICD-10-CM | POA: Diagnosis not present

## 2022-10-02 DIAGNOSIS — M0609 Rheumatoid arthritis without rheumatoid factor, multiple sites: Secondary | ICD-10-CM | POA: Diagnosis not present

## 2022-10-02 DIAGNOSIS — Z139 Encounter for screening, unspecified: Secondary | ICD-10-CM | POA: Diagnosis not present

## 2022-10-02 DIAGNOSIS — E559 Vitamin D deficiency, unspecified: Secondary | ICD-10-CM | POA: Diagnosis not present

## 2022-10-02 DIAGNOSIS — M8580 Other specified disorders of bone density and structure, unspecified site: Secondary | ICD-10-CM | POA: Diagnosis not present

## 2022-10-02 DIAGNOSIS — E782 Mixed hyperlipidemia: Secondary | ICD-10-CM | POA: Diagnosis not present

## 2022-10-02 DIAGNOSIS — I10 Essential (primary) hypertension: Secondary | ICD-10-CM | POA: Diagnosis not present

## 2023-01-02 DIAGNOSIS — Z6832 Body mass index (BMI) 32.0-32.9, adult: Secondary | ICD-10-CM | POA: Diagnosis not present

## 2023-01-02 DIAGNOSIS — E1169 Type 2 diabetes mellitus with other specified complication: Secondary | ICD-10-CM | POA: Diagnosis not present

## 2023-01-02 DIAGNOSIS — E559 Vitamin D deficiency, unspecified: Secondary | ICD-10-CM | POA: Diagnosis not present

## 2023-01-02 DIAGNOSIS — E039 Hypothyroidism, unspecified: Secondary | ICD-10-CM | POA: Diagnosis not present

## 2023-01-02 DIAGNOSIS — Z139 Encounter for screening, unspecified: Secondary | ICD-10-CM | POA: Diagnosis not present

## 2023-01-02 DIAGNOSIS — E782 Mixed hyperlipidemia: Secondary | ICD-10-CM | POA: Diagnosis not present

## 2023-02-18 DIAGNOSIS — Z1231 Encounter for screening mammogram for malignant neoplasm of breast: Secondary | ICD-10-CM | POA: Diagnosis not present

## 2023-04-08 DIAGNOSIS — E1169 Type 2 diabetes mellitus with other specified complication: Secondary | ICD-10-CM | POA: Diagnosis not present

## 2023-04-08 DIAGNOSIS — E782 Mixed hyperlipidemia: Secondary | ICD-10-CM | POA: Diagnosis not present

## 2023-04-08 DIAGNOSIS — I10 Essential (primary) hypertension: Secondary | ICD-10-CM | POA: Diagnosis not present

## 2023-04-08 DIAGNOSIS — E559 Vitamin D deficiency, unspecified: Secondary | ICD-10-CM | POA: Diagnosis not present

## 2023-04-08 DIAGNOSIS — E039 Hypothyroidism, unspecified: Secondary | ICD-10-CM | POA: Diagnosis not present

## 2023-06-11 DIAGNOSIS — Z79899 Other long term (current) drug therapy: Secondary | ICD-10-CM | POA: Diagnosis not present

## 2023-06-11 DIAGNOSIS — M8580 Other specified disorders of bone density and structure, unspecified site: Secondary | ICD-10-CM | POA: Diagnosis not present

## 2023-06-11 DIAGNOSIS — M0609 Rheumatoid arthritis without rheumatoid factor, multiple sites: Secondary | ICD-10-CM | POA: Diagnosis not present

## 2023-07-08 DIAGNOSIS — Z6833 Body mass index (BMI) 33.0-33.9, adult: Secondary | ICD-10-CM | POA: Diagnosis not present

## 2023-07-08 DIAGNOSIS — E1169 Type 2 diabetes mellitus with other specified complication: Secondary | ICD-10-CM | POA: Diagnosis not present

## 2023-07-08 DIAGNOSIS — I10 Essential (primary) hypertension: Secondary | ICD-10-CM | POA: Diagnosis not present

## 2023-07-08 DIAGNOSIS — E782 Mixed hyperlipidemia: Secondary | ICD-10-CM | POA: Diagnosis not present

## 2023-07-08 DIAGNOSIS — M0609 Rheumatoid arthritis without rheumatoid factor, multiple sites: Secondary | ICD-10-CM | POA: Diagnosis not present

## 2023-07-08 DIAGNOSIS — E034 Atrophy of thyroid (acquired): Secondary | ICD-10-CM | POA: Diagnosis not present

## 2023-07-08 DIAGNOSIS — R7401 Elevation of levels of liver transaminase levels: Secondary | ICD-10-CM | POA: Diagnosis not present

## 2023-07-08 DIAGNOSIS — Z9181 History of falling: Secondary | ICD-10-CM | POA: Diagnosis not present

## 2023-08-27 DIAGNOSIS — R748 Abnormal levels of other serum enzymes: Secondary | ICD-10-CM | POA: Diagnosis not present

## 2023-10-01 DIAGNOSIS — Z23 Encounter for immunization: Secondary | ICD-10-CM | POA: Diagnosis not present

## 2023-10-01 DIAGNOSIS — Z7960 Long term (current) use of unspecified immunomodulators and immunosuppressants: Secondary | ICD-10-CM | POA: Diagnosis not present

## 2023-10-01 DIAGNOSIS — M0609 Rheumatoid arthritis without rheumatoid factor, multiple sites: Secondary | ICD-10-CM | POA: Diagnosis not present

## 2023-10-01 DIAGNOSIS — Z79899 Other long term (current) drug therapy: Secondary | ICD-10-CM | POA: Diagnosis not present

## 2023-10-01 DIAGNOSIS — M2550 Pain in unspecified joint: Secondary | ICD-10-CM | POA: Diagnosis not present

## 2023-10-07 DIAGNOSIS — M0609 Rheumatoid arthritis without rheumatoid factor, multiple sites: Secondary | ICD-10-CM | POA: Diagnosis not present

## 2023-10-07 DIAGNOSIS — S76011A Strain of muscle, fascia and tendon of right hip, initial encounter: Secondary | ICD-10-CM | POA: Diagnosis not present

## 2023-10-07 DIAGNOSIS — E034 Atrophy of thyroid (acquired): Secondary | ICD-10-CM | POA: Diagnosis not present

## 2023-10-07 DIAGNOSIS — I10 Essential (primary) hypertension: Secondary | ICD-10-CM | POA: Diagnosis not present

## 2023-10-07 DIAGNOSIS — E782 Mixed hyperlipidemia: Secondary | ICD-10-CM | POA: Diagnosis not present

## 2023-10-07 DIAGNOSIS — Z6833 Body mass index (BMI) 33.0-33.9, adult: Secondary | ICD-10-CM | POA: Diagnosis not present

## 2023-10-07 DIAGNOSIS — E1169 Type 2 diabetes mellitus with other specified complication: Secondary | ICD-10-CM | POA: Diagnosis not present

## 2023-10-29 DIAGNOSIS — Z9181 History of falling: Secondary | ICD-10-CM | POA: Diagnosis not present

## 2023-10-29 DIAGNOSIS — R7989 Other specified abnormal findings of blood chemistry: Secondary | ICD-10-CM | POA: Diagnosis not present

## 2023-10-29 DIAGNOSIS — Z0000 Encounter for general adult medical examination without abnormal findings: Secondary | ICD-10-CM | POA: Diagnosis not present

## 2024-01-02 DIAGNOSIS — M0609 Rheumatoid arthritis without rheumatoid factor, multiple sites: Secondary | ICD-10-CM | POA: Diagnosis not present

## 2024-01-02 DIAGNOSIS — Z79899 Other long term (current) drug therapy: Secondary | ICD-10-CM | POA: Diagnosis not present

## 2024-01-02 DIAGNOSIS — M8580 Other specified disorders of bone density and structure, unspecified site: Secondary | ICD-10-CM | POA: Diagnosis not present

## 2024-01-02 DIAGNOSIS — M2550 Pain in unspecified joint: Secondary | ICD-10-CM | POA: Diagnosis not present

## 2024-01-02 DIAGNOSIS — Z7960 Long term (current) use of unspecified immunomodulators and immunosuppressants: Secondary | ICD-10-CM | POA: Diagnosis not present

## 2024-01-21 DIAGNOSIS — E782 Mixed hyperlipidemia: Secondary | ICD-10-CM | POA: Diagnosis not present

## 2024-01-21 DIAGNOSIS — R809 Proteinuria, unspecified: Secondary | ICD-10-CM | POA: Diagnosis not present

## 2024-01-21 DIAGNOSIS — E1129 Type 2 diabetes mellitus with other diabetic kidney complication: Secondary | ICD-10-CM | POA: Diagnosis not present

## 2024-01-21 DIAGNOSIS — E034 Atrophy of thyroid (acquired): Secondary | ICD-10-CM | POA: Diagnosis not present

## 2024-01-21 DIAGNOSIS — M0609 Rheumatoid arthritis without rheumatoid factor, multiple sites: Secondary | ICD-10-CM | POA: Diagnosis not present

## 2024-01-21 DIAGNOSIS — I10 Essential (primary) hypertension: Secondary | ICD-10-CM | POA: Diagnosis not present

## 2024-01-21 DIAGNOSIS — Z6832 Body mass index (BMI) 32.0-32.9, adult: Secondary | ICD-10-CM | POA: Diagnosis not present

## 2024-01-21 DIAGNOSIS — Z1231 Encounter for screening mammogram for malignant neoplasm of breast: Secondary | ICD-10-CM | POA: Diagnosis not present

## 2024-04-17 DIAGNOSIS — M2550 Pain in unspecified joint: Secondary | ICD-10-CM | POA: Diagnosis not present

## 2024-04-17 DIAGNOSIS — M0609 Rheumatoid arthritis without rheumatoid factor, multiple sites: Secondary | ICD-10-CM | POA: Diagnosis not present

## 2024-04-17 DIAGNOSIS — Z7960 Long term (current) use of unspecified immunomodulators and immunosuppressants: Secondary | ICD-10-CM | POA: Diagnosis not present

## 2024-04-17 DIAGNOSIS — M8580 Other specified disorders of bone density and structure, unspecified site: Secondary | ICD-10-CM | POA: Diagnosis not present

## 2024-04-17 DIAGNOSIS — Z79899 Other long term (current) drug therapy: Secondary | ICD-10-CM | POA: Diagnosis not present

## 2024-04-21 DIAGNOSIS — Z6833 Body mass index (BMI) 33.0-33.9, adult: Secondary | ICD-10-CM | POA: Diagnosis not present

## 2024-04-21 DIAGNOSIS — M0609 Rheumatoid arthritis without rheumatoid factor, multiple sites: Secondary | ICD-10-CM | POA: Diagnosis not present

## 2024-04-21 DIAGNOSIS — E034 Atrophy of thyroid (acquired): Secondary | ICD-10-CM | POA: Diagnosis not present

## 2024-04-21 DIAGNOSIS — E782 Mixed hyperlipidemia: Secondary | ICD-10-CM | POA: Diagnosis not present

## 2024-04-21 DIAGNOSIS — M8580 Other specified disorders of bone density and structure, unspecified site: Secondary | ICD-10-CM | POA: Diagnosis not present

## 2024-04-21 DIAGNOSIS — N959 Unspecified menopausal and perimenopausal disorder: Secondary | ICD-10-CM | POA: Diagnosis not present

## 2024-04-21 DIAGNOSIS — Z794 Long term (current) use of insulin: Secondary | ICD-10-CM | POA: Diagnosis not present

## 2024-04-21 DIAGNOSIS — I10 Essential (primary) hypertension: Secondary | ICD-10-CM | POA: Diagnosis not present

## 2024-04-21 DIAGNOSIS — E1129 Type 2 diabetes mellitus with other diabetic kidney complication: Secondary | ICD-10-CM | POA: Diagnosis not present

## 2024-04-21 DIAGNOSIS — R809 Proteinuria, unspecified: Secondary | ICD-10-CM | POA: Diagnosis not present

## 2024-06-24 DIAGNOSIS — M810 Age-related osteoporosis without current pathological fracture: Secondary | ICD-10-CM | POA: Diagnosis not present

## 2024-06-24 DIAGNOSIS — N959 Unspecified menopausal and perimenopausal disorder: Secondary | ICD-10-CM | POA: Diagnosis not present

## 2024-06-24 DIAGNOSIS — Z1231 Encounter for screening mammogram for malignant neoplasm of breast: Secondary | ICD-10-CM | POA: Diagnosis not present

## 2024-06-26 DIAGNOSIS — E559 Vitamin D deficiency, unspecified: Secondary | ICD-10-CM | POA: Diagnosis not present

## 2024-06-26 DIAGNOSIS — Z13820 Encounter for screening for osteoporosis: Secondary | ICD-10-CM | POA: Diagnosis not present

## 2024-06-26 DIAGNOSIS — N959 Unspecified menopausal and perimenopausal disorder: Secondary | ICD-10-CM | POA: Diagnosis not present

## 2024-06-26 DIAGNOSIS — M810 Age-related osteoporosis without current pathological fracture: Secondary | ICD-10-CM | POA: Diagnosis not present

## 2024-07-22 DIAGNOSIS — I10 Essential (primary) hypertension: Secondary | ICD-10-CM | POA: Diagnosis not present

## 2024-07-22 DIAGNOSIS — R809 Proteinuria, unspecified: Secondary | ICD-10-CM | POA: Diagnosis not present

## 2024-07-22 DIAGNOSIS — E034 Atrophy of thyroid (acquired): Secondary | ICD-10-CM | POA: Diagnosis not present

## 2024-07-22 DIAGNOSIS — M0609 Rheumatoid arthritis without rheumatoid factor, multiple sites: Secondary | ICD-10-CM | POA: Diagnosis not present

## 2024-07-22 DIAGNOSIS — E1129 Type 2 diabetes mellitus with other diabetic kidney complication: Secondary | ICD-10-CM | POA: Diagnosis not present

## 2024-07-22 DIAGNOSIS — Z6833 Body mass index (BMI) 33.0-33.9, adult: Secondary | ICD-10-CM | POA: Diagnosis not present

## 2024-07-22 DIAGNOSIS — E782 Mixed hyperlipidemia: Secondary | ICD-10-CM | POA: Diagnosis not present

## 2024-07-22 DIAGNOSIS — M810 Age-related osteoporosis without current pathological fracture: Secondary | ICD-10-CM | POA: Diagnosis not present

## 2024-07-27 DIAGNOSIS — Z7960 Long term (current) use of unspecified immunomodulators and immunosuppressants: Secondary | ICD-10-CM | POA: Diagnosis not present

## 2024-07-27 DIAGNOSIS — M0609 Rheumatoid arthritis without rheumatoid factor, multiple sites: Secondary | ICD-10-CM | POA: Diagnosis not present

## 2024-07-27 DIAGNOSIS — M8580 Other specified disorders of bone density and structure, unspecified site: Secondary | ICD-10-CM | POA: Diagnosis not present

## 2024-07-27 DIAGNOSIS — Z79899 Other long term (current) drug therapy: Secondary | ICD-10-CM | POA: Diagnosis not present

## 2024-07-27 DIAGNOSIS — M2550 Pain in unspecified joint: Secondary | ICD-10-CM | POA: Diagnosis not present

## 2024-07-27 DIAGNOSIS — Z23 Encounter for immunization: Secondary | ICD-10-CM | POA: Diagnosis not present

## 2024-10-21 DIAGNOSIS — I10 Essential (primary) hypertension: Secondary | ICD-10-CM | POA: Diagnosis not present

## 2024-10-21 DIAGNOSIS — E1129 Type 2 diabetes mellitus with other diabetic kidney complication: Secondary | ICD-10-CM | POA: Diagnosis not present

## 2024-10-21 DIAGNOSIS — J0100 Acute maxillary sinusitis, unspecified: Secondary | ICD-10-CM | POA: Diagnosis not present

## 2024-10-21 DIAGNOSIS — M0609 Rheumatoid arthritis without rheumatoid factor, multiple sites: Secondary | ICD-10-CM | POA: Diagnosis not present

## 2024-10-21 DIAGNOSIS — E782 Mixed hyperlipidemia: Secondary | ICD-10-CM | POA: Diagnosis not present

## 2024-10-21 DIAGNOSIS — E034 Atrophy of thyroid (acquired): Secondary | ICD-10-CM | POA: Diagnosis not present

## 2024-10-21 DIAGNOSIS — R809 Proteinuria, unspecified: Secondary | ICD-10-CM | POA: Diagnosis not present

## 2024-10-21 DIAGNOSIS — Z6832 Body mass index (BMI) 32.0-32.9, adult: Secondary | ICD-10-CM | POA: Diagnosis not present
